# Patient Record
Sex: Female | Born: 1940 | Race: White | Hispanic: No | Marital: Married | Smoking: Never smoker
Health system: Southern US, Community
[De-identification: ages and names within clinical notes are randomized; demographics above are authoritative.]

## PROBLEM LIST (undated history)

## (undated) DIAGNOSIS — C55 Malignant neoplasm of uterus, part unspecified: Secondary | ICD-10-CM

## (undated) DIAGNOSIS — C801 Malignant (primary) neoplasm, unspecified: Secondary | ICD-10-CM

## (undated) DIAGNOSIS — R569 Unspecified convulsions: Secondary | ICD-10-CM

## (undated) DIAGNOSIS — E119 Type 2 diabetes mellitus without complications: Secondary | ICD-10-CM

## (undated) HISTORY — PX: TONSILLECTOMY: SUR1361

## (undated) HISTORY — PX: ABDOMINAL HYSTERECTOMY: SHX81

---

## 1998-05-12 ENCOUNTER — Other Ambulatory Visit: Admission: RE | Admit: 1998-05-12 | Discharge: 1998-05-12 | Payer: Self-pay | Admitting: Obstetrics and Gynecology

## 1998-05-30 ENCOUNTER — Inpatient Hospital Stay (HOSPITAL_COMMUNITY): Admission: RE | Admit: 1998-05-30 | Discharge: 1998-06-01 | Payer: Self-pay | Admitting: Obstetrics and Gynecology

## 1999-12-06 ENCOUNTER — Encounter: Admission: RE | Admit: 1999-12-06 | Discharge: 1999-12-06 | Payer: Self-pay | Admitting: *Deleted

## 1999-12-06 ENCOUNTER — Encounter: Payer: Self-pay | Admitting: *Deleted

## 1999-12-25 ENCOUNTER — Encounter: Admission: RE | Admit: 1999-12-25 | Discharge: 1999-12-25 | Payer: Self-pay | Admitting: *Deleted

## 1999-12-25 ENCOUNTER — Encounter: Payer: Self-pay | Admitting: *Deleted

## 2009-11-24 ENCOUNTER — Encounter: Admission: RE | Admit: 2009-11-24 | Discharge: 2009-11-24 | Payer: Self-pay | Admitting: Internal Medicine

## 2009-12-27 ENCOUNTER — Encounter: Admission: RE | Admit: 2009-12-27 | Discharge: 2009-12-27 | Payer: Self-pay | Admitting: Internal Medicine

## 2009-12-27 ENCOUNTER — Other Ambulatory Visit: Admission: RE | Admit: 2009-12-27 | Discharge: 2009-12-27 | Payer: Self-pay | Admitting: Interventional Radiology

## 2010-07-10 ENCOUNTER — Other Ambulatory Visit: Payer: Self-pay | Admitting: Gastroenterology

## 2012-01-08 ENCOUNTER — Other Ambulatory Visit: Payer: Self-pay | Admitting: Internal Medicine

## 2012-01-08 DIAGNOSIS — Z1231 Encounter for screening mammogram for malignant neoplasm of breast: Secondary | ICD-10-CM

## 2012-02-06 ENCOUNTER — Ambulatory Visit
Admission: RE | Admit: 2012-02-06 | Discharge: 2012-02-06 | Disposition: A | Discharge status: Home / Self Care | Payer: Medicare Other | Source: Ambulatory Visit | Attending: Internal Medicine | Admitting: Internal Medicine

## 2012-02-06 DIAGNOSIS — Z1231 Encounter for screening mammogram for malignant neoplasm of breast: Secondary | ICD-10-CM

## 2014-10-06 ENCOUNTER — Other Ambulatory Visit: Payer: Self-pay | Admitting: Internal Medicine

## 2014-10-06 DIAGNOSIS — Z1231 Encounter for screening mammogram for malignant neoplasm of breast: Secondary | ICD-10-CM

## 2014-10-13 ENCOUNTER — Ambulatory Visit
Admission: RE | Admit: 2014-10-13 | Discharge: 2014-10-13 | Disposition: A | Discharge status: Home / Self Care | Payer: Medicare HMO | Source: Ambulatory Visit | Attending: Internal Medicine | Admitting: Internal Medicine

## 2014-10-13 DIAGNOSIS — Z1231 Encounter for screening mammogram for malignant neoplasm of breast: Secondary | ICD-10-CM

## 2015-04-10 ENCOUNTER — Other Ambulatory Visit: Payer: Self-pay | Admitting: Internal Medicine

## 2015-04-10 DIAGNOSIS — G459 Transient cerebral ischemic attack, unspecified: Secondary | ICD-10-CM | POA: Diagnosis not present

## 2015-04-10 DIAGNOSIS — M25569 Pain in unspecified knee: Secondary | ICD-10-CM | POA: Diagnosis not present

## 2015-04-10 DIAGNOSIS — E7800 Pure hypercholesterolemia, unspecified: Secondary | ICD-10-CM | POA: Diagnosis not present

## 2015-04-10 DIAGNOSIS — Z0000 Encounter for general adult medical examination without abnormal findings: Secondary | ICD-10-CM | POA: Diagnosis not present

## 2015-04-10 DIAGNOSIS — E119 Type 2 diabetes mellitus without complications: Secondary | ICD-10-CM | POA: Diagnosis not present

## 2015-04-10 DIAGNOSIS — Z1389 Encounter for screening for other disorder: Secondary | ICD-10-CM | POA: Diagnosis not present

## 2015-04-20 ENCOUNTER — Ambulatory Visit
Admission: RE | Admit: 2015-04-20 | Discharge: 2015-04-20 | Disposition: A | Discharge status: Home / Self Care | Payer: Medicare HMO | Source: Ambulatory Visit | Attending: Internal Medicine | Admitting: Internal Medicine

## 2015-04-20 DIAGNOSIS — I6523 Occlusion and stenosis of bilateral carotid arteries: Secondary | ICD-10-CM | POA: Diagnosis not present

## 2015-04-20 DIAGNOSIS — G459 Transient cerebral ischemic attack, unspecified: Secondary | ICD-10-CM

## 2015-10-11 DIAGNOSIS — R69 Illness, unspecified: Secondary | ICD-10-CM | POA: Diagnosis not present

## 2015-10-11 DIAGNOSIS — E119 Type 2 diabetes mellitus without complications: Secondary | ICD-10-CM | POA: Diagnosis not present

## 2016-04-18 ENCOUNTER — Other Ambulatory Visit: Payer: Self-pay | Admitting: Internal Medicine

## 2016-04-18 ENCOUNTER — Ambulatory Visit
Admission: RE | Admit: 2016-04-18 | Discharge: 2016-04-18 | Disposition: A | Discharge status: Home / Self Care | Payer: Medicare HMO | Source: Ambulatory Visit | Attending: Internal Medicine | Admitting: Internal Medicine

## 2016-04-18 DIAGNOSIS — Z0000 Encounter for general adult medical examination without abnormal findings: Secondary | ICD-10-CM | POA: Diagnosis not present

## 2016-04-18 DIAGNOSIS — M1711 Unilateral primary osteoarthritis, right knee: Secondary | ICD-10-CM | POA: Diagnosis not present

## 2016-04-18 DIAGNOSIS — Z86010 Personal history of colonic polyps: Secondary | ICD-10-CM | POA: Diagnosis not present

## 2016-04-18 DIAGNOSIS — E7800 Pure hypercholesterolemia, unspecified: Secondary | ICD-10-CM | POA: Diagnosis not present

## 2016-04-18 DIAGNOSIS — Z1389 Encounter for screening for other disorder: Secondary | ICD-10-CM | POA: Diagnosis not present

## 2016-04-18 DIAGNOSIS — M25561 Pain in right knee: Secondary | ICD-10-CM | POA: Diagnosis not present

## 2016-04-18 DIAGNOSIS — E119 Type 2 diabetes mellitus without complications: Secondary | ICD-10-CM | POA: Diagnosis not present

## 2016-04-18 DIAGNOSIS — E049 Nontoxic goiter, unspecified: Secondary | ICD-10-CM | POA: Diagnosis not present

## 2016-04-18 DIAGNOSIS — G8929 Other chronic pain: Secondary | ICD-10-CM | POA: Diagnosis not present

## 2016-05-04 DIAGNOSIS — Z791 Long term (current) use of non-steroidal anti-inflammatories (NSAID): Secondary | ICD-10-CM | POA: Diagnosis not present

## 2016-05-04 DIAGNOSIS — Z0000 Encounter for general adult medical examination without abnormal findings: Secondary | ICD-10-CM | POA: Diagnosis not present

## 2016-05-04 DIAGNOSIS — M13161 Monoarthritis, not elsewhere classified, right knee: Secondary | ICD-10-CM | POA: Diagnosis not present

## 2016-05-04 DIAGNOSIS — M13162 Monoarthritis, not elsewhere classified, left knee: Secondary | ICD-10-CM | POA: Diagnosis not present

## 2016-05-04 DIAGNOSIS — G4700 Insomnia, unspecified: Secondary | ICD-10-CM | POA: Diagnosis not present

## 2016-05-04 DIAGNOSIS — Z972 Presence of dental prosthetic device (complete) (partial): Secondary | ICD-10-CM | POA: Diagnosis not present

## 2016-05-04 DIAGNOSIS — E7800 Pure hypercholesterolemia, unspecified: Secondary | ICD-10-CM | POA: Diagnosis not present

## 2016-05-04 DIAGNOSIS — R69 Illness, unspecified: Secondary | ICD-10-CM | POA: Diagnosis not present

## 2016-05-04 DIAGNOSIS — R600 Localized edema: Secondary | ICD-10-CM | POA: Diagnosis not present

## 2016-05-22 ENCOUNTER — Other Ambulatory Visit: Payer: Self-pay | Admitting: Gastroenterology

## 2016-07-30 ENCOUNTER — Encounter (HOSPITAL_COMMUNITY): Payer: Self-pay | Admitting: *Deleted

## 2016-07-30 ENCOUNTER — Ambulatory Visit (HOSPITAL_COMMUNITY)
Admission: RE | Admit: 2016-07-30 | Discharge: 2016-07-30 | Disposition: A | Discharge status: Home / Self Care | Payer: Medicare HMO | Source: Ambulatory Visit | Attending: Gastroenterology | Admitting: Gastroenterology

## 2016-07-30 ENCOUNTER — Ambulatory Visit (HOSPITAL_COMMUNITY): Payer: Medicare HMO | Admitting: Certified Registered Nurse Anesthetist

## 2016-07-30 ENCOUNTER — Encounter (HOSPITAL_COMMUNITY)
Admission: RE | Disposition: A | Discharge status: Home / Self Care | Payer: Self-pay | Source: Ambulatory Visit | Attending: Gastroenterology

## 2016-07-30 DIAGNOSIS — Z86010 Personal history of colonic polyps: Secondary | ICD-10-CM | POA: Insufficient documentation

## 2016-07-30 DIAGNOSIS — Z8542 Personal history of malignant neoplasm of other parts of uterus: Secondary | ICD-10-CM | POA: Insufficient documentation

## 2016-07-30 DIAGNOSIS — Z1211 Encounter for screening for malignant neoplasm of colon: Secondary | ICD-10-CM | POA: Diagnosis not present

## 2016-07-30 DIAGNOSIS — Z9049 Acquired absence of other specified parts of digestive tract: Secondary | ICD-10-CM | POA: Diagnosis not present

## 2016-07-30 DIAGNOSIS — E119 Type 2 diabetes mellitus without complications: Secondary | ICD-10-CM | POA: Diagnosis not present

## 2016-07-30 DIAGNOSIS — Z90710 Acquired absence of both cervix and uterus: Secondary | ICD-10-CM | POA: Diagnosis not present

## 2016-07-30 DIAGNOSIS — E042 Nontoxic multinodular goiter: Secondary | ICD-10-CM | POA: Insufficient documentation

## 2016-07-30 DIAGNOSIS — E7800 Pure hypercholesterolemia, unspecified: Secondary | ICD-10-CM | POA: Diagnosis not present

## 2016-07-30 DIAGNOSIS — K5730 Diverticulosis of large intestine without perforation or abscess without bleeding: Secondary | ICD-10-CM | POA: Diagnosis not present

## 2016-07-30 HISTORY — DX: Malignant (primary) neoplasm, unspecified: C80.1

## 2016-07-30 HISTORY — DX: Malignant neoplasm of uterus, part unspecified: C55

## 2016-07-30 HISTORY — PX: COLONOSCOPY WITH PROPOFOL: SHX5780

## 2016-07-30 SURGERY — COLONOSCOPY WITH PROPOFOL
Anesthesia: Monitor Anesthesia Care

## 2016-07-30 MED ORDER — ONDANSETRON HCL 4 MG/2ML IJ SOLN
INTRAMUSCULAR | Status: DC | PRN
  Administered 2016-07-30: 4 mg via INTRAVENOUS

## 2016-07-30 MED ORDER — EPHEDRINE 5 MG/ML INJ
INTRAVENOUS | Status: AC
  Filled 2016-07-30: qty 10

## 2016-07-30 MED ORDER — ONDANSETRON HCL 4 MG/2ML IJ SOLN
INTRAMUSCULAR | Status: AC
  Filled 2016-07-30: qty 2

## 2016-07-30 MED ORDER — PROPOFOL 10 MG/ML IV BOLUS
INTRAVENOUS | Status: AC
  Filled 2016-07-30: qty 40

## 2016-07-30 MED ORDER — PROPOFOL 500 MG/50ML IV EMUL
INTRAVENOUS | Status: DC | PRN
Start: 2016-07-30 — End: 2016-07-30
  Administered 2016-07-30: 125 ug/kg/min via INTRAVENOUS

## 2016-07-30 MED ORDER — EPHEDRINE SULFATE 50 MG/ML IJ SOLN
INTRAMUSCULAR | Status: DC | PRN
Start: 2016-07-30 — End: 2016-07-30
  Administered 2016-07-30: 5 mg via INTRAVENOUS

## 2016-07-30 MED ORDER — LACTATED RINGERS IV SOLN
INTRAVENOUS | Status: DC
  Administered 2016-07-30: 08:00:00 via INTRAVENOUS

## 2016-07-30 SURGICAL SUPPLY — 21 items

## 2016-07-30 NOTE — Op Note (Signed)
[facility] Patient Name: Tracy Odonnell Procedure Date: 07/30/2016 MRN: [id number] Attending MD: Garlan Fair , MD Date of Birth: May 05, 1940 CSN: [id number] Age: 76 Admit Type: Outpatient Procedure:                Colonoscopy Indications:              High risk colon cancer surveillance: Personal                            history of non-advanced adenoma Providers:                Garlan Fair, MD, Kingsley Plan, RN, Lillie Fragmin, RN, Cherylynn Ridges, Technician, Christell Faith, CRNA Referring MD:              Medicines:                Propofol per Anesthesia Complications:            No immediate complications. Estimated Blood Loss:     Estimated blood loss: none. Procedure:                Pre-Anesthesia Assessment:                           - Prior to the procedure, a History and Physical                            was performed, and patient medications and                            allergies were reviewed. The patient's tolerance of                            previous anesthesia was also reviewed. The risks                            and benefits of the procedure and the sedation                            options and risks were discussed with the patient.                            All questions were answered, and informed consent                            was obtained. Prior Anticoagulants: The patient has                            taken no previous anticoagulant or antiplatelet                            agents. ASA Grade  Assessment: II - A patient with                            mild systemic disease. After reviewing the risks                            and benefits, the patient was deemed in                            satisfactory condition to undergo the procedure.                           After obtaining informed consent, the colonoscope                            was passed under direct vision.  Throughout the                            procedure, the patient's blood pressure, pulse, and                            oxygen saturations were monitored continuously. The                            EC-[id number] ([id number]) scope was introduced through                            the anus and advanced to the the cecum, identified                            by appendiceal orifice and ileocecal valve. The                            colonoscopy was performed without difficulty. The                            patient tolerated the procedure well. The quality                            of the bowel preparation was good. The terminal                            ileum, the ileocecal valve, the appendiceal orifice                            and the rectum were photographed. Scope In: 8:23:26 AM Scope Out: 8:41:11 AM Scope Withdrawal Time: 0 hours 9 minutes 3 seconds  Total Procedure Duration: 0 hours 17 minutes 45 seconds  Findings:      The perianal and digital rectal examinations were normal.      The entire examined colon appeared normal. Left colonic diverticulosis       was present. Impression:               - The entire examined colon is normal.                           -  No specimens collected. Moderate Sedation:      N/A- Per Anesthesia Care Recommendation:           - Patient has a contact number available for                            emergencies. The signs and symptoms of potential                            delayed complications were discussed with the                            patient. Return to normal activities tomorrow.                            Written discharge instructions were provided to the                            patient.                           - Repeat colonoscopy is not recommended for                            surveillance.                           - Resume previous diet.                           - Continue present medications. Procedure Code(s):        ---  Professional ---                           G5364, Colorectal cancer screening; colonoscopy on                            individual at high risk Diagnosis Code(s):        --- Professional ---                           Z86.010, Personal history of colonic polyps CPT copyright 2016 [employer]. All rights reserved. The codes documented in this report are preliminary and upon coder review may  be revised to meet current compliance requirements. Earle Gell, MD Garlan Fair, MD 07/30/2016 8:47:05 AM This report has been signed electronically. Number of Addenda: 0

## 2016-07-30 NOTE — Anesthesia Preprocedure Evaluation (Signed)
Anesthesia Evaluation  Patient identified by MRN, date of birth, ID band Patient awake    Reviewed: Allergy & Precautions, NPO status , Patient's Chart, lab work & pertinent test results  Airway Mallampati: II  TM Distance: >3 FB Neck ROM: Full    Dental no notable dental hx. (+) Partial Upper, Poor Dentition, Chipped   Pulmonary neg pulmonary ROS,    Pulmonary exam normal breath sounds clear to auscultation       Cardiovascular negative cardio ROS Normal cardiovascular exam Rhythm:Regular Rate:Normal     Neuro/Psych negative neurological ROS  negative psych ROS   GI/Hepatic negative GI ROS, Neg liver ROS,   Endo/Other  negative endocrine ROS  Renal/GU negative Renal ROS  negative genitourinary   Musculoskeletal negative musculoskeletal ROS (+)   Abdominal   Peds negative pediatric ROS (+)  Hematology negative hematology ROS (+)   Anesthesia Other Findings   Reproductive/Obstetrics negative OB ROS                             Anesthesia Physical Anesthesia Plan  ASA: II  Anesthesia Plan: MAC   Post-op Pain Management:    Induction: Intravenous  PONV Risk Score and Plan: 0  Airway Management Planned:   Additional Equipment:   Intra-op Plan:   Post-operative Plan:   Informed Consent: I have reviewed the patients History and Physical, chart, labs and discussed the procedure including the risks, benefits and alternatives for the proposed anesthesia with the patient or authorized representative who has indicated his/her understanding and acceptance.   Dental advisory given  Plan Discussed with: CRNA  Anesthesia Plan Comments:         Anesthesia Quick Evaluation

## 2016-07-30 NOTE — H&P (Signed)
Procedure: Surveillance colonoscopy. 07/10/2010 colonoscopy was performed with removal of two 3 mm tubular adenomatous cecal polyps  History: The patient is a 76 year old female born 1941-02-15. She is scheduled to undergo a surveillance colonoscopy today.  Past medical history: Type 2 diabetes mellitus. Hypercholesterolemia. Uterine cancer. Total abdominal hysterectomy. Bilateral salpingo-oophorectomy. Thyroid nodules. Left humeral fracture. Tonsillectomy. Benign right ovarian tumor. Cholecystectomy.  Exam: The patient is alert and lying comfortably on the endoscopy stretcher. Abdomen is soft and nontender to palpation. Lungs are clear to auscultation. Cardiac exam reveals a regular rhythm.  Plan: Proceed with surveillance colonoscopy

## 2016-07-30 NOTE — Transfer of Care (Signed)
Immediate Anesthesia Transfer of Care Note  Patient: Tracy Odonnell  Procedure(s) Performed: Procedure(s): COLONOSCOPY WITH PROPOFOL (N/A)  Patient Location: PACU  Anesthesia Type:MAC  Level of Consciousness:  sedated, patient cooperative and responds to stimulation  Airway & Oxygen Therapy:Patient Spontanous Breathing and Patient connected to face mask oxgen  Post-op Assessment:  Report given to PACU RN and Post -op Vital signs reviewed and stable  Post vital signs:  Reviewed and stable  Last Vitals:  Vitals:   07/30/16 0745  BP: (!) 137/55  Resp: 16  Temp: 91.6 C    Complications: No apparent anesthesia complications

## 2016-07-30 NOTE — Discharge Instructions (Signed)
Colonoscopy, Adult, Care After  This sheet gives you information about how to care for yourself after your procedure. Your health care provider may also give you more specific instructions. If you have problems or questions, contact your health care provider.  What can I expect after the procedure?  After the procedure, it is common to have:  · A small amount of blood in your stool for 24 hours after the procedure.  · Some gas.  · Mild abdominal cramping or bloating.    Follow these instructions at home:  General instructions    · For the first 24 hours after the procedure:  ? Do not drive or use machinery.  ? Do not sign important documents.  ? Do not drink alcohol.  ? Do your regular daily activities at a slower pace than normal.  ? Eat soft, easy-to-digest foods.  ? Rest often.  · Take over-the-counter or prescription medicines only as told by your health care provider.  · It is up to you to get the results of your procedure. Ask your health care provider, or the department performing the procedure, when your results will be ready.  Relieving cramping and bloating  · Try walking around when you have cramps or feel bloated.  · Apply heat to your abdomen as told by your health care provider. Use a heat source that your health care provider recommends, such as a moist heat pack or a heating pad.  ? Place a towel between your skin and the heat source.  ? Leave the heat on for 20-30 minutes.  ? Remove the heat if your skin turns bright red. This is especially important if you are unable to feel pain, heat, or cold. You may have a greater risk of getting burned.  Eating and drinking  · Drink enough fluid to keep your urine clear or pale yellow.  · Resume your normal diet as instructed by your health care provider. Avoid heavy or fried foods that are hard to digest.  · Avoid drinking alcohol for as long as instructed by your health care provider.  Contact a health care provider if:  · You have blood in your stool 2-3  days after the procedure.  Get help right away if:  · You have more than a small spotting of blood in your stool.  · You pass large blood clots in your stool.  · Your abdomen is swollen.  · You have nausea or vomiting.  · You have a fever.  · You have increasing abdominal pain that is not relieved with medicine.  This information is not intended to replace advice given to you by your health care provider. Make sure you discuss any questions you have with your health care provider.  Document Released: 09/26/2003 Document Revised: 11/06/2015 Document Reviewed: 04/25/2015  [employer]vier Interactive Patient Education © 2018 [employer]

## 2016-07-30 NOTE — Anesthesia Postprocedure Evaluation (Signed)
Anesthesia Post Note  Patient: Tracy Odonnell  Procedure(s) Performed: Procedure(s) (LRB): COLONOSCOPY WITH PROPOFOL (N/A)     Patient location during evaluation: Endoscopy Anesthesia Type: MAC Level of consciousness: awake and alert Pain management: pain level controlled Vital Signs Assessment: post-procedure vital signs reviewed and stable Respiratory status: spontaneous breathing, nonlabored ventilation, respiratory function stable and patient connected to nasal cannula oxygen Cardiovascular status: stable and blood pressure returned to baseline Anesthetic complications: no    Last Vitals:  Vitals:   07/30/16 0850 07/30/16 0900  BP: (!) 117/50 138/75  Pulse: (!) 55 (!) 59  Resp: 17 17  Temp:      Last Pain:  Vitals:   07/30/16 0745  TempSrc: Oral                 [name]

## 2016-07-31 ENCOUNTER — Encounter (HOSPITAL_COMMUNITY): Payer: Self-pay | Admitting: Gastroenterology

## 2016-10-24 DIAGNOSIS — E119 Type 2 diabetes mellitus without complications: Secondary | ICD-10-CM | POA: Diagnosis not present

## 2016-10-24 DIAGNOSIS — R69 Illness, unspecified: Secondary | ICD-10-CM | POA: Diagnosis not present

## 2016-10-24 DIAGNOSIS — E1165 Type 2 diabetes mellitus with hyperglycemia: Secondary | ICD-10-CM | POA: Diagnosis not present

## 2017-02-13 DIAGNOSIS — H11423 Conjunctival edema, bilateral: Secondary | ICD-10-CM | POA: Diagnosis not present

## 2017-02-13 DIAGNOSIS — H52223 Regular astigmatism, bilateral: Secondary | ICD-10-CM | POA: Diagnosis not present

## 2017-02-13 DIAGNOSIS — H40033 Anatomical narrow angle, bilateral: Secondary | ICD-10-CM | POA: Diagnosis not present

## 2017-02-13 DIAGNOSIS — H18413 Arcus senilis, bilateral: Secondary | ICD-10-CM | POA: Diagnosis not present

## 2017-02-13 DIAGNOSIS — H25013 Cortical age-related cataract, bilateral: Secondary | ICD-10-CM | POA: Diagnosis not present

## 2017-02-13 DIAGNOSIS — H2513 Age-related nuclear cataract, bilateral: Secondary | ICD-10-CM | POA: Diagnosis not present

## 2017-02-13 DIAGNOSIS — H11153 Pinguecula, bilateral: Secondary | ICD-10-CM | POA: Diagnosis not present

## 2017-02-13 DIAGNOSIS — H25043 Posterior subcapsular polar age-related cataract, bilateral: Secondary | ICD-10-CM | POA: Diagnosis not present

## 2017-02-13 DIAGNOSIS — H5213 Myopia, bilateral: Secondary | ICD-10-CM | POA: Diagnosis not present

## 2017-02-25 DIAGNOSIS — I4510 Unspecified right bundle-branch block: Secondary | ICD-10-CM | POA: Diagnosis not present

## 2017-02-25 DIAGNOSIS — R55 Syncope and collapse: Secondary | ICD-10-CM | POA: Diagnosis not present

## 2017-02-25 DIAGNOSIS — R4182 Altered mental status, unspecified: Secondary | ICD-10-CM | POA: Diagnosis not present

## 2017-02-25 DIAGNOSIS — R410 Disorientation, unspecified: Secondary | ICD-10-CM | POA: Diagnosis not present

## 2017-02-25 DIAGNOSIS — R402441 Other coma, without documented Glasgow coma scale score, or with partial score reported, in the field [EMT or ambulance]: Secondary | ICD-10-CM | POA: Diagnosis not present

## 2017-02-25 DIAGNOSIS — S01512A Laceration without foreign body of oral cavity, initial encounter: Secondary | ICD-10-CM | POA: Diagnosis not present

## 2017-02-25 DIAGNOSIS — R569 Unspecified convulsions: Secondary | ICD-10-CM | POA: Diagnosis not present

## 2017-02-25 DIAGNOSIS — X58XXXA Exposure to other specified factors, initial encounter: Secondary | ICD-10-CM | POA: Diagnosis not present

## 2017-02-26 DIAGNOSIS — I4510 Unspecified right bundle-branch block: Secondary | ICD-10-CM | POA: Diagnosis not present

## 2017-03-31 DIAGNOSIS — Z79899 Other long term (current) drug therapy: Secondary | ICD-10-CM | POA: Diagnosis not present

## 2017-03-31 DIAGNOSIS — R569 Unspecified convulsions: Secondary | ICD-10-CM | POA: Diagnosis not present

## 2017-04-03 DIAGNOSIS — R55 Syncope and collapse: Secondary | ICD-10-CM | POA: Diagnosis not present

## 2017-04-03 DIAGNOSIS — R258 Other abnormal involuntary movements: Secondary | ICD-10-CM | POA: Diagnosis not present

## 2017-04-03 DIAGNOSIS — G319 Degenerative disease of nervous system, unspecified: Secondary | ICD-10-CM | POA: Diagnosis not present

## 2017-04-03 DIAGNOSIS — R569 Unspecified convulsions: Secondary | ICD-10-CM | POA: Diagnosis not present

## 2017-05-02 DIAGNOSIS — H2513 Age-related nuclear cataract, bilateral: Secondary | ICD-10-CM | POA: Diagnosis not present

## 2017-05-02 DIAGNOSIS — H18413 Arcus senilis, bilateral: Secondary | ICD-10-CM | POA: Diagnosis not present

## 2017-05-02 DIAGNOSIS — H25013 Cortical age-related cataract, bilateral: Secondary | ICD-10-CM | POA: Diagnosis not present

## 2017-05-02 DIAGNOSIS — H25043 Posterior subcapsular polar age-related cataract, bilateral: Secondary | ICD-10-CM | POA: Diagnosis not present

## 2017-05-02 DIAGNOSIS — H2512 Age-related nuclear cataract, left eye: Secondary | ICD-10-CM | POA: Diagnosis not present

## 2017-05-08 ENCOUNTER — Other Ambulatory Visit: Payer: Self-pay | Admitting: Internal Medicine

## 2017-05-08 DIAGNOSIS — E669 Obesity, unspecified: Secondary | ICD-10-CM | POA: Diagnosis not present

## 2017-05-08 DIAGNOSIS — E119 Type 2 diabetes mellitus without complications: Secondary | ICD-10-CM | POA: Diagnosis not present

## 2017-05-08 DIAGNOSIS — E7800 Pure hypercholesterolemia, unspecified: Secondary | ICD-10-CM | POA: Diagnosis not present

## 2017-05-08 DIAGNOSIS — Z0000 Encounter for general adult medical examination without abnormal findings: Secondary | ICD-10-CM | POA: Diagnosis not present

## 2017-05-08 DIAGNOSIS — Z1389 Encounter for screening for other disorder: Secondary | ICD-10-CM | POA: Diagnosis not present

## 2017-05-08 DIAGNOSIS — G40909 Epilepsy, unspecified, not intractable, without status epilepticus: Secondary | ICD-10-CM | POA: Diagnosis not present

## 2017-05-08 DIAGNOSIS — Z1231 Encounter for screening mammogram for malignant neoplasm of breast: Secondary | ICD-10-CM

## 2017-05-30 DIAGNOSIS — H2511 Age-related nuclear cataract, right eye: Secondary | ICD-10-CM | POA: Diagnosis not present

## 2017-05-30 DIAGNOSIS — H2512 Age-related nuclear cataract, left eye: Secondary | ICD-10-CM | POA: Diagnosis not present

## 2017-06-04 ENCOUNTER — Encounter: Payer: Self-pay | Admitting: Radiology

## 2017-06-04 ENCOUNTER — Ambulatory Visit
Admission: RE | Admit: 2017-06-04 | Discharge: 2017-06-04 | Disposition: A | Discharge status: Home / Self Care | Payer: Medicare HMO | Source: Ambulatory Visit | Attending: Internal Medicine | Admitting: Internal Medicine

## 2017-06-04 DIAGNOSIS — Z1231 Encounter for screening mammogram for malignant neoplasm of breast: Secondary | ICD-10-CM

## 2017-06-23 DIAGNOSIS — H2511 Age-related nuclear cataract, right eye: Secondary | ICD-10-CM | POA: Diagnosis not present

## 2017-09-11 DIAGNOSIS — Z887 Allergy status to serum and vaccine status: Secondary | ICD-10-CM | POA: Diagnosis not present

## 2017-09-11 DIAGNOSIS — Z79899 Other long term (current) drug therapy: Secondary | ICD-10-CM | POA: Diagnosis not present

## 2017-09-11 DIAGNOSIS — R569 Unspecified convulsions: Secondary | ICD-10-CM | POA: Diagnosis not present

## 2017-12-11 DIAGNOSIS — G40909 Epilepsy, unspecified, not intractable, without status epilepticus: Secondary | ICD-10-CM | POA: Diagnosis not present

## 2017-12-11 DIAGNOSIS — E119 Type 2 diabetes mellitus without complications: Secondary | ICD-10-CM | POA: Diagnosis not present

## 2017-12-11 DIAGNOSIS — Z23 Encounter for immunization: Secondary | ICD-10-CM | POA: Diagnosis not present

## 2018-03-17 DIAGNOSIS — R259 Unspecified abnormal involuntary movements: Secondary | ICD-10-CM | POA: Diagnosis not present

## 2018-03-17 DIAGNOSIS — Z733 Stress, not elsewhere classified: Secondary | ICD-10-CM | POA: Diagnosis not present

## 2018-03-17 DIAGNOSIS — Z79899 Other long term (current) drug therapy: Secondary | ICD-10-CM | POA: Diagnosis not present

## 2018-03-17 DIAGNOSIS — R569 Unspecified convulsions: Secondary | ICD-10-CM | POA: Diagnosis not present

## 2018-07-31 DIAGNOSIS — H02831 Dermatochalasis of right upper eyelid: Secondary | ICD-10-CM | POA: Diagnosis not present

## 2018-07-31 DIAGNOSIS — H18413 Arcus senilis, bilateral: Secondary | ICD-10-CM | POA: Diagnosis not present

## 2018-07-31 DIAGNOSIS — H11153 Pinguecula, bilateral: Secondary | ICD-10-CM | POA: Diagnosis not present

## 2018-07-31 DIAGNOSIS — H26491 Other secondary cataract, right eye: Secondary | ICD-10-CM | POA: Diagnosis not present

## 2018-07-31 DIAGNOSIS — H02834 Dermatochalasis of left upper eyelid: Secondary | ICD-10-CM | POA: Diagnosis not present

## 2018-07-31 DIAGNOSIS — H0102B Squamous blepharitis left eye, upper and lower eyelids: Secondary | ICD-10-CM | POA: Diagnosis not present

## 2018-07-31 DIAGNOSIS — H35371 Puckering of macula, right eye: Secondary | ICD-10-CM | POA: Diagnosis not present

## 2018-07-31 DIAGNOSIS — H0102A Squamous blepharitis right eye, upper and lower eyelids: Secondary | ICD-10-CM | POA: Diagnosis not present

## 2018-07-31 DIAGNOSIS — H26492 Other secondary cataract, left eye: Secondary | ICD-10-CM | POA: Diagnosis not present

## 2018-09-07 DIAGNOSIS — Z1389 Encounter for screening for other disorder: Secondary | ICD-10-CM | POA: Diagnosis not present

## 2018-09-07 DIAGNOSIS — Z0000 Encounter for general adult medical examination without abnormal findings: Secondary | ICD-10-CM | POA: Diagnosis not present

## 2018-10-06 DIAGNOSIS — R609 Edema, unspecified: Secondary | ICD-10-CM | POA: Diagnosis not present

## 2018-10-06 DIAGNOSIS — E7800 Pure hypercholesterolemia, unspecified: Secondary | ICD-10-CM | POA: Diagnosis not present

## 2018-10-06 DIAGNOSIS — M170 Bilateral primary osteoarthritis of knee: Secondary | ICD-10-CM | POA: Diagnosis not present

## 2018-10-06 DIAGNOSIS — E1169 Type 2 diabetes mellitus with other specified complication: Secondary | ICD-10-CM | POA: Diagnosis not present

## 2018-12-12 IMAGING — DX DG KNEE COMPLETE 4+V*R*
4 series · 4 of 4 positions shown · non-contrast
Comparison: 01/31/2010

CLINICAL DATA: Right knee pain for several months, no known injury

EXAM:
RIGHT KNEE - COMPLETE 4+ VIEW

[dg knee complete 4 views right (1 of 4)]
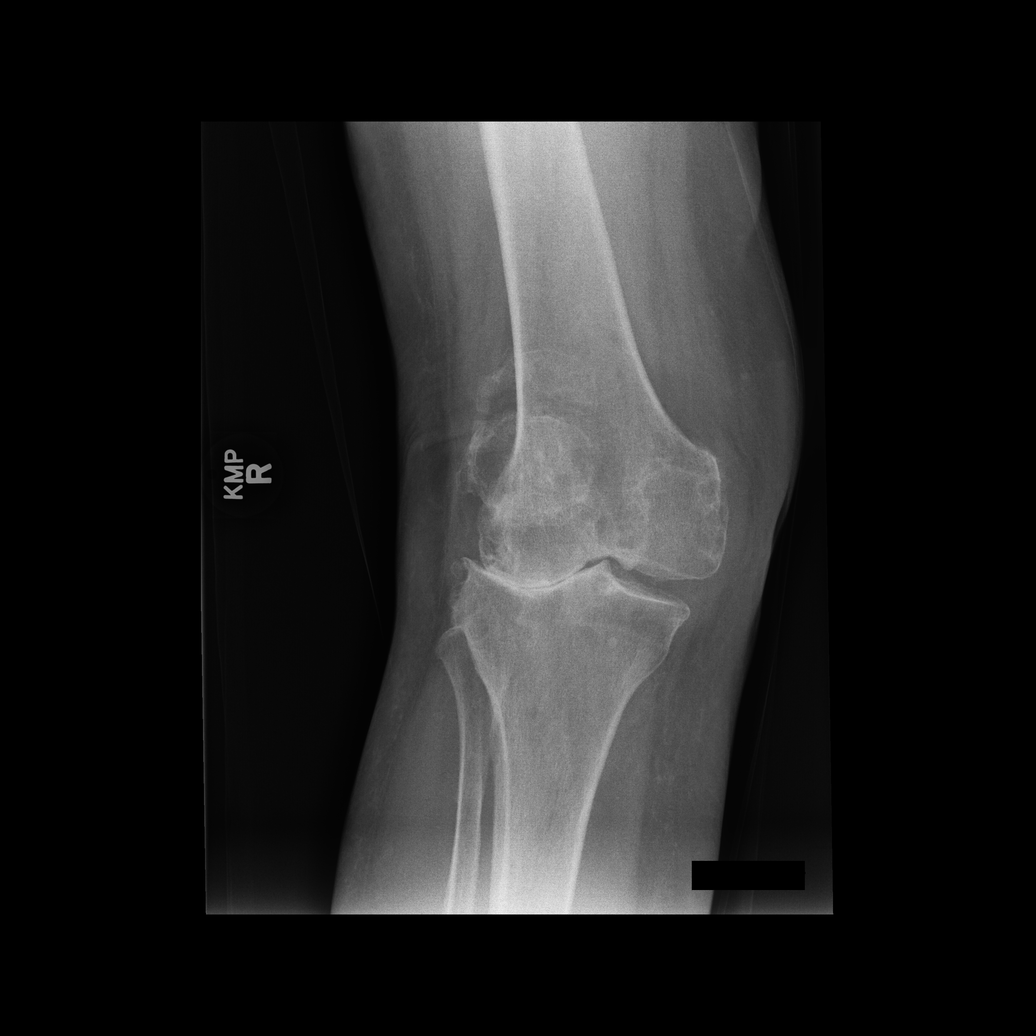

[dg knee complete 4 views right (2 of 4)]
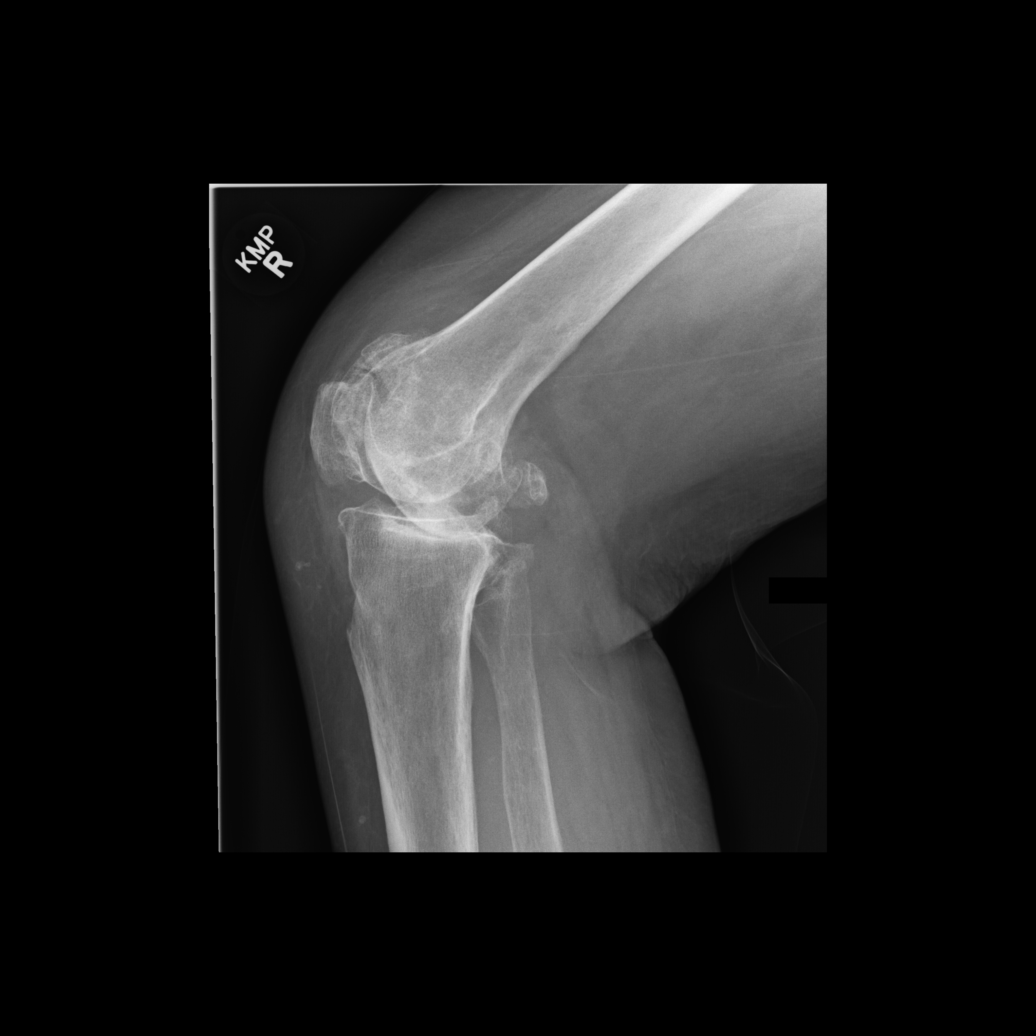

[dg knee complete 4 views right (3 of 4)]
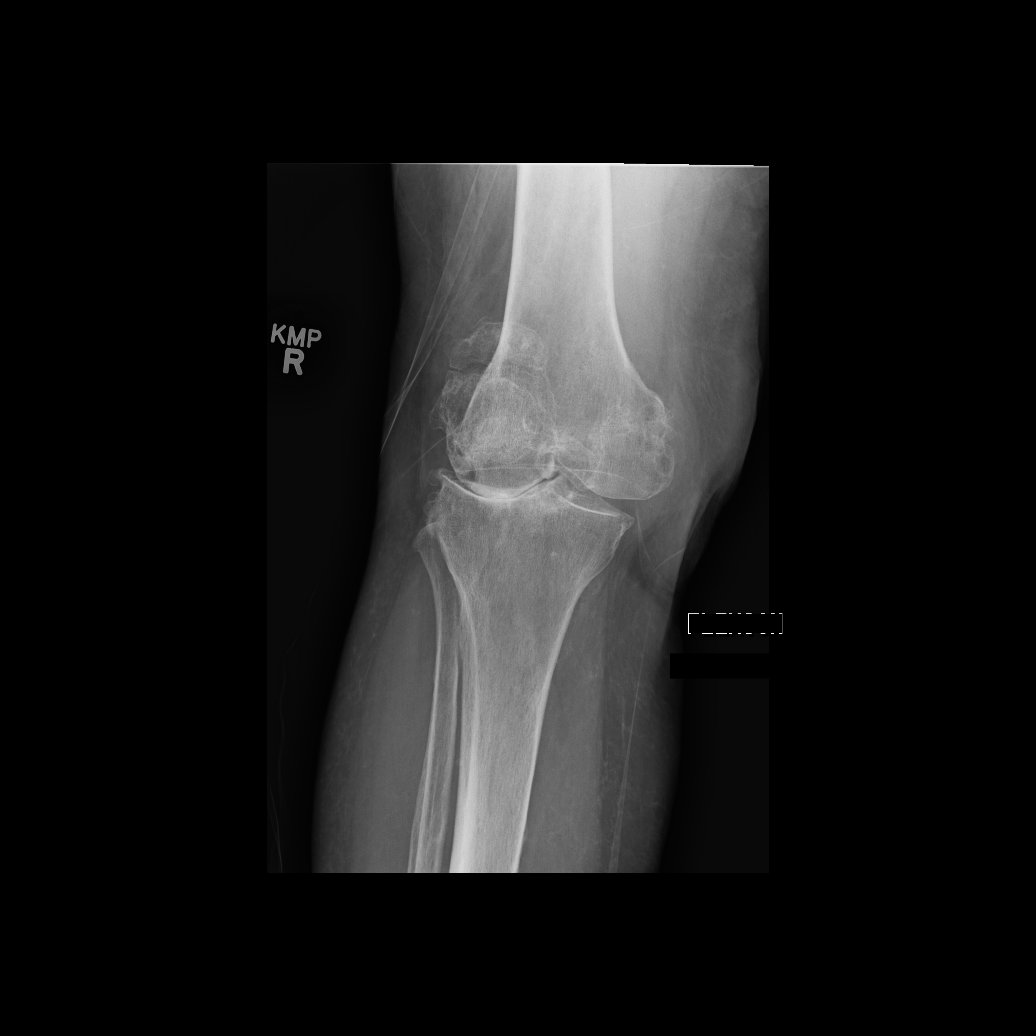

[dg knee complete 4 views right (4 of 4)]
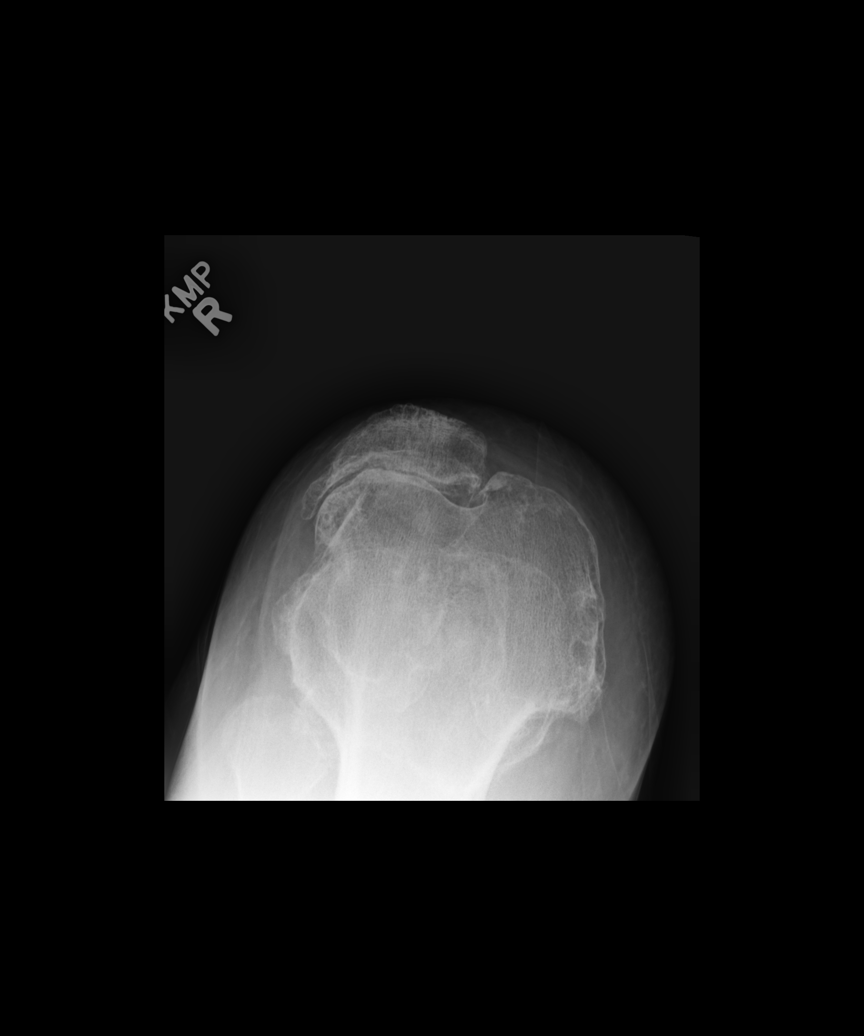

[4 of 4 positions shown; findings below may reference images not displayed]

FINDINGS: Four views of the right knee submitted. Significant degenerative
changes with significant narrowing of of lateral joint compartment.
There is sclerosis of lateral tibial plateau. Mild spurring of
lateral tibial plateau. Significant spurring of patella. Significant
narrowing of patellofemoral joint space. Small joint effusion. Again
noted dystrophic calcifications suprapatellar region. No acute
fracture or subluxation.
IMPRESSION: No acute fracture or subluxation. Extensive osteoarthritic changes
as described above with progression from prior exam. Small joint
effusion.

## 2019-04-15 DIAGNOSIS — E1169 Type 2 diabetes mellitus with other specified complication: Secondary | ICD-10-CM | POA: Diagnosis not present

## 2019-04-15 DIAGNOSIS — R609 Edema, unspecified: Secondary | ICD-10-CM | POA: Diagnosis not present

## 2019-04-15 DIAGNOSIS — G40909 Epilepsy, unspecified, not intractable, without status epilepticus: Secondary | ICD-10-CM | POA: Diagnosis not present

## 2019-04-22 DIAGNOSIS — I6782 Cerebral ischemia: Secondary | ICD-10-CM | POA: Diagnosis not present

## 2019-04-22 DIAGNOSIS — R569 Unspecified convulsions: Secondary | ICD-10-CM | POA: Diagnosis not present

## 2019-04-22 DIAGNOSIS — Z79899 Other long term (current) drug therapy: Secondary | ICD-10-CM | POA: Diagnosis not present

## 2019-04-29 DIAGNOSIS — E1169 Type 2 diabetes mellitus with other specified complication: Secondary | ICD-10-CM | POA: Diagnosis not present

## 2019-05-07 DIAGNOSIS — R197 Diarrhea, unspecified: Secondary | ICD-10-CM | POA: Diagnosis not present

## 2019-05-07 DIAGNOSIS — K5900 Constipation, unspecified: Secondary | ICD-10-CM | POA: Diagnosis not present

## 2019-05-07 DIAGNOSIS — R739 Hyperglycemia, unspecified: Secondary | ICD-10-CM | POA: Diagnosis not present

## 2019-05-07 DIAGNOSIS — N3000 Acute cystitis without hematuria: Secondary | ICD-10-CM | POA: Diagnosis not present

## 2019-05-07 DIAGNOSIS — K8689 Other specified diseases of pancreas: Secondary | ICD-10-CM | POA: Diagnosis not present

## 2019-05-07 DIAGNOSIS — E876 Hypokalemia: Secondary | ICD-10-CM | POA: Diagnosis not present

## 2019-05-07 DIAGNOSIS — G40909 Epilepsy, unspecified, not intractable, without status epilepticus: Secondary | ICD-10-CM | POA: Diagnosis not present

## 2019-05-07 DIAGNOSIS — K56699 Other intestinal obstruction unspecified as to partial versus complete obstruction: Secondary | ICD-10-CM | POA: Diagnosis not present

## 2019-05-07 DIAGNOSIS — R300 Dysuria: Secondary | ICD-10-CM | POA: Diagnosis not present

## 2019-05-07 DIAGNOSIS — K56600 Partial intestinal obstruction, unspecified as to cause: Secondary | ICD-10-CM | POA: Diagnosis not present

## 2019-05-07 DIAGNOSIS — E1165 Type 2 diabetes mellitus with hyperglycemia: Secondary | ICD-10-CM | POA: Diagnosis not present

## 2019-05-07 DIAGNOSIS — R7303 Prediabetes: Secondary | ICD-10-CM | POA: Diagnosis not present

## 2019-05-07 DIAGNOSIS — I82452 Acute embolism and thrombosis of left peroneal vein: Secondary | ICD-10-CM | POA: Diagnosis not present

## 2019-05-07 DIAGNOSIS — I10 Essential (primary) hypertension: Secondary | ICD-10-CM | POA: Diagnosis not present

## 2019-05-07 DIAGNOSIS — R109 Unspecified abdominal pain: Secondary | ICD-10-CM | POA: Diagnosis not present

## 2019-05-07 DIAGNOSIS — E669 Obesity, unspecified: Secondary | ICD-10-CM | POA: Diagnosis not present

## 2019-05-07 DIAGNOSIS — R112 Nausea with vomiting, unspecified: Secondary | ICD-10-CM | POA: Diagnosis not present

## 2019-05-07 DIAGNOSIS — E870 Hyperosmolality and hypernatremia: Secondary | ICD-10-CM | POA: Diagnosis not present

## 2019-05-07 DIAGNOSIS — I82443 Acute embolism and thrombosis of tibial vein, bilateral: Secondary | ICD-10-CM | POA: Diagnosis not present

## 2019-05-07 DIAGNOSIS — R1030 Lower abdominal pain, unspecified: Secondary | ICD-10-CM | POA: Diagnosis not present

## 2019-05-08 DIAGNOSIS — K8689 Other specified diseases of pancreas: Secondary | ICD-10-CM | POA: Diagnosis not present

## 2019-05-08 DIAGNOSIS — G40909 Epilepsy, unspecified, not intractable, without status epilepticus: Secondary | ICD-10-CM | POA: Diagnosis not present

## 2019-05-08 DIAGNOSIS — K56600 Partial intestinal obstruction, unspecified as to cause: Secondary | ICD-10-CM | POA: Diagnosis not present

## 2019-05-08 DIAGNOSIS — R739 Hyperglycemia, unspecified: Secondary | ICD-10-CM | POA: Diagnosis not present

## 2019-05-08 DIAGNOSIS — R7303 Prediabetes: Secondary | ICD-10-CM | POA: Diagnosis not present

## 2019-05-09 DIAGNOSIS — K56600 Partial intestinal obstruction, unspecified as to cause: Secondary | ICD-10-CM | POA: Diagnosis not present

## 2019-05-09 DIAGNOSIS — R7303 Prediabetes: Secondary | ICD-10-CM | POA: Diagnosis not present

## 2019-05-09 DIAGNOSIS — K8689 Other specified diseases of pancreas: Secondary | ICD-10-CM | POA: Diagnosis not present

## 2019-05-09 DIAGNOSIS — R739 Hyperglycemia, unspecified: Secondary | ICD-10-CM | POA: Diagnosis not present

## 2019-05-09 DIAGNOSIS — G40909 Epilepsy, unspecified, not intractable, without status epilepticus: Secondary | ICD-10-CM | POA: Diagnosis not present

## 2019-05-10 DIAGNOSIS — G40909 Epilepsy, unspecified, not intractable, without status epilepticus: Secondary | ICD-10-CM | POA: Diagnosis not present

## 2019-05-10 DIAGNOSIS — K56600 Partial intestinal obstruction, unspecified as to cause: Secondary | ICD-10-CM | POA: Diagnosis not present

## 2019-05-10 DIAGNOSIS — R7303 Prediabetes: Secondary | ICD-10-CM | POA: Diagnosis not present

## 2019-05-10 DIAGNOSIS — E870 Hyperosmolality and hypernatremia: Secondary | ICD-10-CM | POA: Diagnosis not present

## 2019-05-10 DIAGNOSIS — K8689 Other specified diseases of pancreas: Secondary | ICD-10-CM | POA: Diagnosis not present

## 2019-05-11 DIAGNOSIS — E1165 Type 2 diabetes mellitus with hyperglycemia: Secondary | ICD-10-CM | POA: Diagnosis not present

## 2019-05-11 DIAGNOSIS — K56600 Partial intestinal obstruction, unspecified as to cause: Secondary | ICD-10-CM | POA: Diagnosis not present

## 2019-05-11 DIAGNOSIS — G40909 Epilepsy, unspecified, not intractable, without status epilepticus: Secondary | ICD-10-CM | POA: Diagnosis not present

## 2019-05-11 DIAGNOSIS — E870 Hyperosmolality and hypernatremia: Secondary | ICD-10-CM | POA: Diagnosis not present

## 2019-05-11 DIAGNOSIS — K8689 Other specified diseases of pancreas: Secondary | ICD-10-CM | POA: Diagnosis not present

## 2019-05-12 DIAGNOSIS — E1165 Type 2 diabetes mellitus with hyperglycemia: Secondary | ICD-10-CM | POA: Diagnosis not present

## 2019-05-12 DIAGNOSIS — E870 Hyperosmolality and hypernatremia: Secondary | ICD-10-CM | POA: Diagnosis not present

## 2019-05-12 DIAGNOSIS — R300 Dysuria: Secondary | ICD-10-CM | POA: Diagnosis not present

## 2019-05-12 DIAGNOSIS — K8689 Other specified diseases of pancreas: Secondary | ICD-10-CM | POA: Diagnosis not present

## 2019-05-12 DIAGNOSIS — G40909 Epilepsy, unspecified, not intractable, without status epilepticus: Secondary | ICD-10-CM | POA: Diagnosis not present

## 2019-05-12 DIAGNOSIS — K56600 Partial intestinal obstruction, unspecified as to cause: Secondary | ICD-10-CM | POA: Diagnosis not present

## 2019-05-13 DIAGNOSIS — G40909 Epilepsy, unspecified, not intractable, without status epilepticus: Secondary | ICD-10-CM | POA: Diagnosis not present

## 2019-05-13 DIAGNOSIS — K56600 Partial intestinal obstruction, unspecified as to cause: Secondary | ICD-10-CM | POA: Diagnosis not present

## 2019-05-13 DIAGNOSIS — K8689 Other specified diseases of pancreas: Secondary | ICD-10-CM | POA: Diagnosis not present

## 2019-05-13 DIAGNOSIS — E1165 Type 2 diabetes mellitus with hyperglycemia: Secondary | ICD-10-CM | POA: Diagnosis not present

## 2019-05-13 DIAGNOSIS — E870 Hyperosmolality and hypernatremia: Secondary | ICD-10-CM | POA: Diagnosis not present

## 2019-05-14 DIAGNOSIS — G40909 Epilepsy, unspecified, not intractable, without status epilepticus: Secondary | ICD-10-CM | POA: Diagnosis not present

## 2019-05-14 DIAGNOSIS — K56600 Partial intestinal obstruction, unspecified as to cause: Secondary | ICD-10-CM | POA: Diagnosis not present

## 2019-05-14 DIAGNOSIS — E1165 Type 2 diabetes mellitus with hyperglycemia: Secondary | ICD-10-CM | POA: Diagnosis not present

## 2019-05-14 DIAGNOSIS — K8689 Other specified diseases of pancreas: Secondary | ICD-10-CM | POA: Diagnosis not present

## 2019-05-15 DIAGNOSIS — K8689 Other specified diseases of pancreas: Secondary | ICD-10-CM | POA: Diagnosis not present

## 2019-05-15 DIAGNOSIS — G40909 Epilepsy, unspecified, not intractable, without status epilepticus: Secondary | ICD-10-CM | POA: Diagnosis not present

## 2019-05-15 DIAGNOSIS — K56600 Partial intestinal obstruction, unspecified as to cause: Secondary | ICD-10-CM | POA: Diagnosis not present

## 2019-05-15 DIAGNOSIS — E1165 Type 2 diabetes mellitus with hyperglycemia: Secondary | ICD-10-CM | POA: Diagnosis not present

## 2019-05-16 DIAGNOSIS — E1165 Type 2 diabetes mellitus with hyperglycemia: Secondary | ICD-10-CM | POA: Diagnosis not present

## 2019-05-16 DIAGNOSIS — I82443 Acute embolism and thrombosis of tibial vein, bilateral: Secondary | ICD-10-CM | POA: Diagnosis not present

## 2019-05-16 DIAGNOSIS — I82452 Acute embolism and thrombosis of left peroneal vein: Secondary | ICD-10-CM | POA: Diagnosis not present

## 2019-05-16 DIAGNOSIS — K56600 Partial intestinal obstruction, unspecified as to cause: Secondary | ICD-10-CM | POA: Diagnosis not present

## 2019-05-16 DIAGNOSIS — G40909 Epilepsy, unspecified, not intractable, without status epilepticus: Secondary | ICD-10-CM | POA: Diagnosis not present

## 2019-05-16 DIAGNOSIS — K8689 Other specified diseases of pancreas: Secondary | ICD-10-CM | POA: Diagnosis not present

## 2019-05-17 DIAGNOSIS — K56600 Partial intestinal obstruction, unspecified as to cause: Secondary | ICD-10-CM | POA: Diagnosis not present

## 2019-05-21 DIAGNOSIS — E119 Type 2 diabetes mellitus without complications: Secondary | ICD-10-CM | POA: Diagnosis not present

## 2019-05-21 DIAGNOSIS — N390 Urinary tract infection, site not specified: Secondary | ICD-10-CM | POA: Diagnosis not present

## 2019-05-21 DIAGNOSIS — D6869 Other thrombophilia: Secondary | ICD-10-CM | POA: Diagnosis not present

## 2019-05-21 DIAGNOSIS — Z7984 Long term (current) use of oral hypoglycemic drugs: Secondary | ICD-10-CM | POA: Diagnosis not present

## 2019-05-21 DIAGNOSIS — Z8719 Personal history of other diseases of the digestive system: Secondary | ICD-10-CM | POA: Diagnosis not present

## 2019-05-22 DIAGNOSIS — R69 Illness, unspecified: Secondary | ICD-10-CM | POA: Diagnosis not present

## 2019-05-24 DIAGNOSIS — R945 Abnormal results of liver function studies: Secondary | ICD-10-CM | POA: Diagnosis not present

## 2019-05-25 DIAGNOSIS — R7989 Other specified abnormal findings of blood chemistry: Secondary | ICD-10-CM | POA: Diagnosis not present

## 2019-05-25 DIAGNOSIS — K7689 Other specified diseases of liver: Secondary | ICD-10-CM | POA: Diagnosis not present

## 2019-05-27 DIAGNOSIS — R17 Unspecified jaundice: Secondary | ICD-10-CM | POA: Diagnosis not present

## 2019-05-27 DIAGNOSIS — Z7984 Long term (current) use of oral hypoglycemic drugs: Secondary | ICD-10-CM | POA: Diagnosis not present

## 2019-05-27 DIAGNOSIS — K8689 Other specified diseases of pancreas: Secondary | ICD-10-CM | POA: Diagnosis not present

## 2019-05-27 DIAGNOSIS — E1165 Type 2 diabetes mellitus with hyperglycemia: Secondary | ICD-10-CM | POA: Diagnosis not present

## 2019-05-27 DIAGNOSIS — R7989 Other specified abnormal findings of blood chemistry: Secondary | ICD-10-CM | POA: Diagnosis not present

## 2019-05-27 DIAGNOSIS — K5641 Fecal impaction: Secondary | ICD-10-CM | POA: Diagnosis not present

## 2019-05-27 DIAGNOSIS — Z8719 Personal history of other diseases of the digestive system: Secondary | ICD-10-CM | POA: Diagnosis not present

## 2019-05-29 ENCOUNTER — Encounter (HOSPITAL_COMMUNITY): Payer: Medicare HMO

## 2019-05-29 ENCOUNTER — Other Ambulatory Visit: Payer: Self-pay

## 2019-05-29 ENCOUNTER — Encounter (HOSPITAL_COMMUNITY): Payer: Self-pay | Admitting: Pediatrics

## 2019-05-29 ENCOUNTER — Inpatient Hospital Stay (HOSPITAL_COMMUNITY)
Admission: EM | Admit: 2019-05-29 | Discharge: 2019-06-05 | DRG: 436 | Disposition: A | Discharge status: Home Health | Payer: Medicare HMO | Attending: Internal Medicine | Admitting: Internal Medicine

## 2019-05-29 ENCOUNTER — Emergency Department (HOSPITAL_COMMUNITY): Payer: Medicare HMO

## 2019-05-29 DIAGNOSIS — R569 Unspecified convulsions: Secondary | ICD-10-CM | POA: Diagnosis not present

## 2019-05-29 DIAGNOSIS — I82409 Acute embolism and thrombosis of unspecified deep veins of unspecified lower extremity: Secondary | ICD-10-CM | POA: Diagnosis not present

## 2019-05-29 DIAGNOSIS — R748 Abnormal levels of other serum enzymes: Secondary | ICD-10-CM | POA: Diagnosis not present

## 2019-05-29 DIAGNOSIS — Z515 Encounter for palliative care: Secondary | ICD-10-CM | POA: Diagnosis not present

## 2019-05-29 DIAGNOSIS — D649 Anemia, unspecified: Secondary | ICD-10-CM | POA: Diagnosis not present

## 2019-05-29 DIAGNOSIS — R109 Unspecified abdominal pain: Secondary | ICD-10-CM | POA: Diagnosis not present

## 2019-05-29 DIAGNOSIS — Z79899 Other long term (current) drug therapy: Secondary | ICD-10-CM | POA: Diagnosis not present

## 2019-05-29 DIAGNOSIS — K861 Other chronic pancreatitis: Secondary | ICD-10-CM | POA: Diagnosis present

## 2019-05-29 DIAGNOSIS — R1110 Vomiting, unspecified: Secondary | ICD-10-CM | POA: Diagnosis not present

## 2019-05-29 DIAGNOSIS — Z90710 Acquired absence of both cervix and uterus: Secondary | ICD-10-CM | POA: Diagnosis not present

## 2019-05-29 DIAGNOSIS — R110 Nausea: Secondary | ICD-10-CM | POA: Diagnosis not present

## 2019-05-29 DIAGNOSIS — E1151 Type 2 diabetes mellitus with diabetic peripheral angiopathy without gangrene: Secondary | ICD-10-CM | POA: Diagnosis not present

## 2019-05-29 DIAGNOSIS — K8689 Other specified diseases of pancreas: Secondary | ICD-10-CM | POA: Diagnosis not present

## 2019-05-29 DIAGNOSIS — I10 Essential (primary) hypertension: Secondary | ICD-10-CM | POA: Diagnosis not present

## 2019-05-29 DIAGNOSIS — Z8542 Personal history of malignant neoplasm of other parts of uterus: Secondary | ICD-10-CM

## 2019-05-29 DIAGNOSIS — Z800 Family history of malignant neoplasm of digestive organs: Secondary | ICD-10-CM | POA: Diagnosis not present

## 2019-05-29 DIAGNOSIS — Z794 Long term (current) use of insulin: Secondary | ICD-10-CM | POA: Diagnosis not present

## 2019-05-29 DIAGNOSIS — C259 Malignant neoplasm of pancreas, unspecified: Secondary | ICD-10-CM

## 2019-05-29 DIAGNOSIS — E860 Dehydration: Secondary | ICD-10-CM | POA: Diagnosis not present

## 2019-05-29 DIAGNOSIS — Z86718 Personal history of other venous thrombosis and embolism: Secondary | ICD-10-CM | POA: Diagnosis not present

## 2019-05-29 DIAGNOSIS — G40909 Epilepsy, unspecified, not intractable, without status epilepticus: Secondary | ICD-10-CM | POA: Diagnosis present

## 2019-05-29 DIAGNOSIS — R932 Abnormal findings on diagnostic imaging of liver and biliary tract: Secondary | ICD-10-CM | POA: Diagnosis not present

## 2019-05-29 DIAGNOSIS — K5730 Diverticulosis of large intestine without perforation or abscess without bleeding: Secondary | ICD-10-CM | POA: Diagnosis present

## 2019-05-29 DIAGNOSIS — R1013 Epigastric pain: Secondary | ICD-10-CM | POA: Diagnosis not present

## 2019-05-29 DIAGNOSIS — E1169 Type 2 diabetes mellitus with other specified complication: Secondary | ICD-10-CM | POA: Diagnosis not present

## 2019-05-29 DIAGNOSIS — C250 Malignant neoplasm of head of pancreas: Principal | ICD-10-CM | POA: Diagnosis present

## 2019-05-29 DIAGNOSIS — E876 Hypokalemia: Secondary | ICD-10-CM | POA: Diagnosis not present

## 2019-05-29 DIAGNOSIS — K5900 Constipation, unspecified: Secondary | ICD-10-CM | POA: Diagnosis present

## 2019-05-29 DIAGNOSIS — R188 Other ascites: Secondary | ICD-10-CM | POA: Diagnosis not present

## 2019-05-29 DIAGNOSIS — Z20822 Contact with and (suspected) exposure to covid-19: Secondary | ICD-10-CM | POA: Diagnosis not present

## 2019-05-29 DIAGNOSIS — C240 Malignant neoplasm of extrahepatic bile duct: Secondary | ICD-10-CM | POA: Diagnosis not present

## 2019-05-29 DIAGNOSIS — Z9049 Acquired absence of other specified parts of digestive tract: Secondary | ICD-10-CM | POA: Diagnosis not present

## 2019-05-29 DIAGNOSIS — K8051 Calculus of bile duct without cholangitis or cholecystitis with obstruction: Secondary | ICD-10-CM | POA: Diagnosis present

## 2019-05-29 DIAGNOSIS — R030 Elevated blood-pressure reading, without diagnosis of hypertension: Secondary | ICD-10-CM | POA: Diagnosis present

## 2019-05-29 DIAGNOSIS — K838 Other specified diseases of biliary tract: Secondary | ICD-10-CM | POA: Diagnosis not present

## 2019-05-29 DIAGNOSIS — R17 Unspecified jaundice: Secondary | ICD-10-CM

## 2019-05-29 DIAGNOSIS — Z7189 Other specified counseling: Secondary | ICD-10-CM | POA: Diagnosis not present

## 2019-05-29 DIAGNOSIS — E1165 Type 2 diabetes mellitus with hyperglycemia: Secondary | ICD-10-CM

## 2019-05-29 DIAGNOSIS — R519 Headache, unspecified: Secondary | ICD-10-CM | POA: Diagnosis not present

## 2019-05-29 DIAGNOSIS — Z03818 Encounter for observation for suspected exposure to other biological agents ruled out: Secondary | ICD-10-CM | POA: Diagnosis not present

## 2019-05-29 DIAGNOSIS — R52 Pain, unspecified: Secondary | ICD-10-CM | POA: Diagnosis not present

## 2019-05-29 DIAGNOSIS — E119 Type 2 diabetes mellitus without complications: Secondary | ICD-10-CM

## 2019-05-29 DIAGNOSIS — C787 Secondary malignant neoplasm of liver and intrahepatic bile duct: Secondary | ICD-10-CM

## 2019-05-29 DIAGNOSIS — R1010 Upper abdominal pain, unspecified: Secondary | ICD-10-CM | POA: Diagnosis not present

## 2019-05-29 DIAGNOSIS — R1031 Right lower quadrant pain: Secondary | ICD-10-CM | POA: Diagnosis not present

## 2019-05-29 DIAGNOSIS — L299 Pruritus, unspecified: Secondary | ICD-10-CM | POA: Diagnosis present

## 2019-05-29 DIAGNOSIS — Z66 Do not resuscitate: Secondary | ICD-10-CM | POA: Diagnosis not present

## 2019-05-29 DIAGNOSIS — R1084 Generalized abdominal pain: Secondary | ICD-10-CM | POA: Diagnosis not present

## 2019-05-29 DIAGNOSIS — K831 Obstruction of bile duct: Secondary | ICD-10-CM | POA: Diagnosis not present

## 2019-05-29 DIAGNOSIS — S0990XA Unspecified injury of head, initial encounter: Secondary | ICD-10-CM | POA: Diagnosis not present

## 2019-05-29 DIAGNOSIS — R935 Abnormal findings on diagnostic imaging of other abdominal regions, including retroperitoneum: Secondary | ICD-10-CM | POA: Diagnosis not present

## 2019-05-29 HISTORY — DX: Type 2 diabetes mellitus without complications: E11.9

## 2019-05-29 HISTORY — DX: Unspecified convulsions: R56.9

## 2019-05-29 LAB — CBC WITH DIFFERENTIAL/PLATELET
Abs Immature Granulocytes: 0.05 10*3/uL (ref 0.00–0.07)
Basophils Absolute: 0 10*3/uL (ref 0.0–0.1)
Basophils Relative: 0 %
Eosinophils Absolute: 0 10*3/uL (ref 0.0–0.5)
Eosinophils Relative: 0 %
HCT: 36.6 % (ref 36.0–46.0)
Hemoglobin: 11.8 g/dL — ABNORMAL LOW (ref 12.0–15.0)
Immature Granulocytes: 1 %
Lymphocytes Relative: 4 %
Lymphs Abs: 0.4 10*3/uL — ABNORMAL LOW (ref 0.7–4.0)
MCH: 30.6 pg (ref 26.0–34.0)
MCHC: 32.2 g/dL (ref 30.0–36.0)
MCV: 95.1 fL (ref 80.0–100.0)
Monocytes Absolute: 0.5 10*3/uL (ref 0.1–1.0)
Monocytes Relative: 4 %
Neutro Abs: 9.7 10*3/uL — ABNORMAL HIGH (ref 1.7–7.7)
Neutrophils Relative %: 91 %
Platelets: 372 10*3/uL (ref 150–400)
RBC: 3.85 MIL/uL — ABNORMAL LOW (ref 3.87–5.11)
RDW: 16.9 % — ABNORMAL HIGH (ref 11.5–15.5)
WBC: 10.6 10*3/uL — ABNORMAL HIGH (ref 4.0–10.5)
nRBC: 0 % (ref 0.0–0.2)

## 2019-05-29 LAB — COMPREHENSIVE METABOLIC PANEL
ALT: 444 U/L — ABNORMAL HIGH (ref 0–44)
AST: 393 U/L — ABNORMAL HIGH (ref 15–41)
Albumin: 3.3 g/dL — ABNORMAL LOW (ref 3.5–5.0)
Alkaline Phosphatase: 782 U/L — ABNORMAL HIGH (ref 38–126)
Anion gap: 17 — ABNORMAL HIGH (ref 5–15)
BUN: 16 mg/dL (ref 8–23)
CO2: 29 mmol/L (ref 22–32)
Calcium: 9.7 mg/dL (ref 8.9–10.3)
Chloride: 92 mmol/L — ABNORMAL LOW (ref 98–111)
Creatinine, Ser: 0.65 mg/dL (ref 0.44–1.00)
GFR calc Af Amer: >60 mL/min (ref >60)
GFR calc non Af Amer: >60 mL/min (ref >60)
Glucose, Bld: 320 mg/dL — ABNORMAL HIGH (ref 70–99)
Potassium: 4.1 mmol/L (ref 3.5–5.1)
Sodium: 138 mmol/L (ref 135–145)
Total Bilirubin: 4.1 mg/dL — ABNORMAL HIGH (ref 0.3–1.2)
Total Protein: 6.9 g/dL (ref 6.5–8.1)

## 2019-05-29 LAB — URINALYSIS, ROUTINE W REFLEX MICROSCOPIC
Bacteria, UA: NONE SEEN
Bilirubin Urine: NEGATIVE
Glucose, UA: >=500 mg/dL — AB
Hgb urine dipstick: NEGATIVE
Ketones, ur: 80 mg/dL — AB
Nitrite: NEGATIVE
Protein, ur: NEGATIVE mg/dL
Specific Gravity, Urine: >1.046 — ABNORMAL HIGH (ref 1.005–1.030)
pH: 5 (ref 5.0–8.0)

## 2019-05-29 LAB — HEPATITIS PANEL, ACUTE
HCV Ab: NONREACTIVE
Hep A IgM: NONREACTIVE
Hep B C IgM: NONREACTIVE
Hepatitis B Surface Ag: NONREACTIVE

## 2019-05-29 LAB — AMMONIA: Ammonia: 36 umol/L — ABNORMAL HIGH (ref 9–35)

## 2019-05-29 LAB — PROTIME-INR
INR: 1 (ref 0.8–1.2)
Prothrombin Time: 13.2 seconds (ref 11.4–15.2)

## 2019-05-29 LAB — GLUCOSE, CAPILLARY
Glucose-Capillary: 255 mg/dL — ABNORMAL HIGH (ref 70–99)
Glucose-Capillary: 185 mg/dL — ABNORMAL HIGH (ref 70–99)

## 2019-05-29 LAB — MAGNESIUM: Magnesium: 2 mg/dL (ref 1.7–2.4)

## 2019-05-29 LAB — HEMOGLOBIN A1C
Hgb A1c MFr Bld: 7.7 % — ABNORMAL HIGH (ref 4.8–5.6)
Mean Plasma Glucose: 174.29 mg/dL

## 2019-05-29 LAB — SARS CORONAVIRUS 2 (TAT 6-24 HRS): SARS Coronavirus 2: NEGATIVE

## 2019-05-29 LAB — LACTIC ACID, PLASMA: Lactic Acid, Venous: 1.1 mmol/L (ref 0.5–1.9)

## 2019-05-29 LAB — LIPASE, BLOOD: Lipase: 40 U/L (ref 11–51)

## 2019-05-29 MED ORDER — LABETALOL HCL 5 MG/ML IV SOLN: 10.0000 mg | Freq: Four times a day (QID) | INTRAVENOUS | Status: DC | PRN

## 2019-05-29 MED ORDER — SODIUM CHLORIDE 0.9 % IV SOLN: INTRAVENOUS | Status: DC

## 2019-05-29 MED ORDER — INSULIN ASPART 100 UNIT/ML ~~LOC~~ SOLN
0.0000 [IU] | SUBCUTANEOUS | Status: DC
  Administered 2019-05-30 (×2): 2 [IU] via SUBCUTANEOUS
  Administered 2019-05-30 (×2): 3 [IU] via SUBCUTANEOUS
  Administered 2019-05-31: 21:00:00 2 [IU] via SUBCUTANEOUS
  Administered 2019-05-31: 3 [IU] via SUBCUTANEOUS
  Administered 2019-05-31: 2 [IU] via SUBCUTANEOUS
  Administered 2019-06-02: 3 [IU] via SUBCUTANEOUS
  Administered 2019-06-02: 5 [IU] via SUBCUTANEOUS
  Administered 2019-06-02: 2 [IU] via SUBCUTANEOUS
  Administered 2019-06-03: 18:00:00 5 [IU] via SUBCUTANEOUS
  Administered 2019-06-03: 3 [IU] via SUBCUTANEOUS
  Administered 2019-06-03 – 2019-06-04 (×3): 2 [IU] via SUBCUTANEOUS
  Administered 2019-06-04: 3 [IU] via SUBCUTANEOUS
  Administered 2019-06-05: 01:00:00 2 [IU] via SUBCUTANEOUS

## 2019-05-29 MED ORDER — INSULIN GLARGINE 100 UNIT/ML ~~LOC~~ SOLN
12.0000 [IU] | Freq: Every day | SUBCUTANEOUS | Status: DC
  Administered 2019-05-29 – 2019-06-03 (×6): 12 [IU] via SUBCUTANEOUS
  Filled 2019-05-29 (×8): qty 0.12

## 2019-05-29 MED ORDER — PIPERACILLIN-TAZOBACTAM 3.375 G IVPB
3.3750 g | Freq: Three times a day (TID) | INTRAVENOUS | Status: DC
  Administered 2019-05-29 – 2019-06-02 (×11): 3.375 g via INTRAVENOUS
  Filled 2019-05-29 (×12): qty 50

## 2019-05-29 MED ORDER — HYDROMORPHONE HCL 1 MG/ML IJ SOLN
0.5000 mg | INTRAMUSCULAR | Status: DC | PRN
  Administered 2019-06-01: 15:00:00 0.5 mg via INTRAVENOUS
  Filled 2019-05-29: qty 1

## 2019-05-29 MED ORDER — INSULIN ASPART 100 UNIT/ML ~~LOC~~ SOLN
0.0000 [IU] | Freq: Three times a day (TID) | SUBCUTANEOUS | Status: DC
  Administered 2019-05-29: 18:00:00 5 [IU] via SUBCUTANEOUS

## 2019-05-29 MED ORDER — METOCLOPRAMIDE HCL 5 MG/ML IJ SOLN
10.0000 mg | Freq: Once | INTRAMUSCULAR | Status: AC
  Administered 2019-05-29: 10 mg via INTRAVENOUS
  Filled 2019-05-29: qty 2

## 2019-05-29 MED ORDER — INSULIN ASPART 100 UNIT/ML ~~LOC~~ SOLN: 0.0000 [IU] | Freq: Every day | SUBCUTANEOUS | Status: DC

## 2019-05-29 MED ORDER — LEVETIRACETAM IN NACL 500 MG/100ML IV SOLN
500.0000 mg | Freq: Two times a day (BID) | INTRAVENOUS | Status: DC
  Administered 2019-05-29 – 2019-06-05 (×14): 500 mg via INTRAVENOUS
  Filled 2019-05-29 (×16): qty 100

## 2019-05-29 MED ORDER — ACETAMINOPHEN 325 MG PO TABS: 650.0000 mg | ORAL_TABLET | Freq: Four times a day (QID) | ORAL | Status: DC | PRN

## 2019-05-29 MED ORDER — ONDANSETRON HCL 4 MG/2ML IJ SOLN
4.0000 mg | Freq: Four times a day (QID) | INTRAMUSCULAR | Status: DC | PRN
  Administered 2019-05-29 – 2019-06-03 (×5): 4 mg via INTRAVENOUS
  Filled 2019-05-29 (×5): qty 2

## 2019-05-29 MED ORDER — SODIUM CHLORIDE 0.9 % IV BOLUS
1000.0000 mL | Freq: Once | INTRAVENOUS | Status: AC
  Administered 2019-05-29: 1000 mL via INTRAVENOUS

## 2019-05-29 MED ORDER — IOHEXOL 300 MG/ML  SOLN
100.0000 mL | Freq: Once | INTRAMUSCULAR | Status: AC | PRN
  Administered 2019-05-29: 15:00:00 100 mL via INTRAVENOUS

## 2019-05-29 MED ORDER — ONDANSETRON HCL 4 MG PO TABS: 4.0000 mg | ORAL_TABLET | Freq: Four times a day (QID) | ORAL | Status: DC | PRN

## 2019-05-29 MED ORDER — ACETAMINOPHEN 650 MG RE SUPP: 650.0000 mg | Freq: Four times a day (QID) | RECTAL | Status: DC | PRN

## 2019-05-29 MED ORDER — ENOXAPARIN SODIUM 40 MG/0.4ML ~~LOC~~ SOLN
40.0000 mg | SUBCUTANEOUS | Status: DC
  Administered 2019-05-29: 40 mg via SUBCUTANEOUS
  Filled 2019-05-29: qty 0.4

## 2019-05-29 NOTE — H&P (Addendum)
History and Physical    Tracy Odonnell [id number] DOB: 1940/05/08 DOA: 05/29/2019  PCP: Lavone Orn, MD  Patient coming from: Home I have personally briefly reviewed patient's old medical records in [place]  Chief Complaint: Right upper quadrant pain and vomiting  HPI: Tracy Odonnell is a 79 y.o. female with medical history significant of insulin-dependent diabetes mellitus, seizure, uterine cancer status post hysterectomy, obesity presents to emergency department due to right upper quadrant pain and vomiting since 2 days.  Vomitus is bilious, dark brown/green in color, copious amount, too many episodes to count, associated with right upper quadrant pain, generalized pruritus, decreased appetite.  No history of dysuria, hematuria, fever, hematemesis, melena.  Reports constipation for which she has been taking stool softeners with little to no help.  She tells me that last night she fell out of her bed trying to put some lotion on her nightstand.  States that she did strike her face against the nightstand.  Denies loss of consciousness.  Reviewed records from care everywhere: Patient recently hospitalized at [facility] due to bowel obstruction.  She was managed conservatively.  Her hospital course was complicated with Citrobacter koseri UTI sensitive with Cipro and bilateral lower extremity DVT.  She discharged home on Cipro and Eliquis 10 mg twice daily for 7 days and then 5 mg twice daily for 3 months.    Patient tells me that she took Eliquis for several days and she stopped taking it as per her PCP recommendations? (Couldn't find the office note in EMR).  She denies worsening leg swelling or redness.  Has history of seizure and diabetes for which she is compliant with her home medicines.  She lives alone at home and denies smoking, alcohol, illicit drug use.  ED Course: Upon arrival to ED: Patient's blood pressure elevated, she is afebrile with  leukocytosis of 10.6, elevated liver enzymes, AST: 393, ALT: 444, alkaline phosphatase: 72, bilirubin: 4.1, lipase: WNL, INR: WNL, ammonia: 36, UA and lactic acid: Pending.  CT head shows mild periventricular small vessel disease.  No infarction, mass or hemorrhage.  CT abdomen/pelvis shows intrahepatic bile duct dilation.  EDP consulted GI.  [facility] hospitalist consulted for admission for elevated liver enzymes.  Review of Systems: As per HPI otherwise negative.    Past Medical History:  Diagnosis Date  . Cancer ([place])   . DM (diabetes mellitus) ([place])   . Seizures ([place])   . Uterine cancer [facility])     Past Surgical History:  Procedure Laterality Date  . ABDOMINAL HYSTERECTOMY    . COLONOSCOPY WITH PROPOFOL N/A 07/30/2016   Procedure: COLONOSCOPY WITH PROPOFOL;  Surgeon: Garlan Fair, MD;  Location: [facility];  Service: Endoscopy;  Laterality: N/A;  . TONSILLECTOMY       reports that she has never smoked. She has never used smokeless tobacco. She reports that she does not drink alcohol or use drugs.  Allergies  Allergen Reactions  . Influenza Vaccines Other (See Comments)    PT STATES SHE GETS THE FLU FROM THE VACCINE    No family history on file.  Prior to Admission medications   Medication Sig Start Date End Date Taking? Authorizing Provider  Insulin Glargine (BASAGLAR KWIKPEN) 100 UNIT/ML Inject 12 Units into the skin at bedtime.   Yes [provider]  levETIRAcetam (KEPPRA) 500 MG tablet Take 500 mg by mouth 2 (two) times daily.   Yes [provider]    Physical Exam: Vitals:  05/29/19 1224 05/29/19 1400 05/29/19 1530 05/29/19 1630  BP:  (!) 144/63 (!) 145/63 (!) 154/66  Pulse:  93 85 88  Resp:  18 17 18   Temp:      TempSrc:      SpO2:  98% 99% 99%  Weight: 81.2 kg     Height: 5\' 2"  (1.575 m)       Constitutional: NAD, calm, comfortable, communicating well, obese Eyes: PERRL, lids and conjunctivae: Icteric ENMT: Mucous membranes are moist.  Posterior pharynx clear of any exudate or lesions.Normal dentition.  Neck: normal, supple, no masses, no thyromegaly Respiratory: clear to auscultation bilaterally, no wheezing, no crackles. Normal respiratory effort. No accessory muscle use.  Cardiovascular: Regular rate and rhythm, no murmurs / rubs / gallops. No extremity edema. 2+ pedal pulses. No carotid bruits.  Abdomen: no tenderness, no masses palpated. No hepatosplenomegaly. Bowel sounds positive.  Musculoskeletal: no clubbing / cyanosis. No joint deformity upper and lower extremities. Good ROM, no contractures. Normal muscle tone.  Skin: Right periorbital bruise noted  neurologic: CN 2-12 grossly intact. Sensation intact, DTR normal. Strength 5/5 in all 4.  Psychiatric: Normal judgment and insight. Alert and oriented x 3. Normal mood.    Labs on Admission: I have personally reviewed following labs and imaging studies  CBC: Recent Labs  Lab 05/29/19 1238  WBC 10.6*  NEUTROABS 9.7*  HGB 11.8*  HCT 36.6  MCV 95.1  PLT [id number]   Basic Metabolic Panel: Recent Labs  Lab 05/29/19 1238  NA 138  K 4.1  CL 92*  CO2 29  GLUCOSE 320*  BUN 16  CREATININE 0.65  CALCIUM 9.7   GFR: Estimated Creatinine Clearance: 59 mL/min (by C-G formula based on SCr of 0.65 mg/dL). Liver Function Tests: Recent Labs  Lab 05/29/19 1238  AST 393*  ALT 444*  ALKPHOS 782*  BILITOT 4.1*  PROT 6.9  ALBUMIN 3.3*   Recent Labs  Lab 05/29/19 1238  LIPASE 40   Recent Labs  Lab 05/29/19 1238  AMMONIA 36*   Coagulation Profile: Recent Labs  Lab 05/29/19 1238  INR 1.0   Cardiac Enzymes: No results for input(s): CKTOTAL, CKMB, CKMBINDEX, TROPONINI in the last 168 hours. BNP (last 3 results) No results for input(s): PROBNP in the last 8760 hours. HbA1C: No results for input(s): HGBA1C in the last 72 hours. CBG: No results for input(s): GLUCAP in the last 168 hours. Lipid Profile: No results for input(s): CHOL, HDL, LDLCALC, TRIG,  CHOLHDL, LDLDIRECT in the last 72 hours. Thyroid Function Tests: No results for input(s): TSH, T4TOTAL, FREET4, T3FREE, THYROIDAB in the last 72 hours. Anemia Panel: No results for input(s): VITAMINB12, FOLATE, FERRITIN, TIBC, IRON, RETICCTPCT in the last 72 hours. Urine analysis: No results found for: COLORURINE, APPEARANCEUR, LABSPEC, PHURINE, GLUCOSEU, HGBUR, BILIRUBINUR, KETONESUR, PROTEINUR, UROBILINOGEN, NITRITE, LEUKOCYTESUR  Radiological Exams on Admission: CT Head Wo Contrast  Result Date: 05/29/2019 CLINICAL DATA:  Pain following fall EXAM: CT HEAD WITHOUT CONTRAST TECHNIQUE: Contiguous axial images were obtained from the base of the skull through the vertex without intravenous contrast. COMPARISON:  February 25, 2017 FINDINGS: Brain: Ventricles and sulci are normal in size and configuration. There is no intracranial mass, hemorrhage, extra-axial fluid collection, or midline shift. There is mild small vessel disease in the centra semiovale, stable. No acute infarct evident. Vascular: No hyperdense vessel. There is calcification in each carotid siphon region. Skull: Bony calvarium a appears intact. Sinuses/Orbits: There is mucosal thickening in several ethmoid air cells. Other visualized paranasal sinuses  are clear. Orbits appear symmetric bilaterally. Other: Mastoid air cells are clear. IMPRESSION: Mild periventricular small vessel disease. No acute infarct. No mass or hemorrhage. There are foci of arterial vascular calcification. Mucosal thickening noted in several ethmoid air cells. Electronically Signed   By: Lowella Grip III M.D.   On: 05/29/2019 15:07   CT Abdomen Pelvis W Contrast  Result Date: 05/29/2019 CLINICAL DATA:  Nausea vomiting. Reported history of a bowel obstruction. Mechanical fall last night. EXAM: CT ABDOMEN AND PELVIS WITH CONTRAST TECHNIQUE: Multidetector CT imaging of the abdomen and pelvis was performed using the standard protocol following bolus administration of  intravenous contrast. CONTRAST:  13mL OMNIPAQUE IOHEXOL 300 MG/ML  SOLN COMPARISON:  CT, 05/07/2019 FINDINGS: Lower chest: No acute abnormality. Hepatobiliary: Liver normal in size. No mass or focal lesion. There is significant intrahepatic bile duct dilation. Common hepatic duct measures 13 mm in diameter. Common bile duct appears normal in caliber. Duct dilation was present on the prior CT, but appears increased. Status post cholecystectomy. Pancreas: Atrophy with irregular dilation of the pancreatic duct, from the body through the tail, but no defined mass and no inflammation. Spleen: Normal in size without focal abnormality. Adrenals/Urinary Tract: Adrenal glands are unremarkable. Kidneys are normal, without renal calculi, focal lesion, or hydronephrosis. Bladder is unremarkable. Stomach/Bowel: Normal stomach. Small bowel and colon are normal in caliber. No wall thickening. No inflammation. There are diverticula most evident along the sigmoid colon. No diverticulitis. No evidence of appendicitis Vascular/Lymphatic: Mild aortic atherosclerosis. No aneurysm. No enlarged lymph nodes. Reproductive: Status post hysterectomy. No adnexal masses. Other: Midline anterior abdominal wall incision. No hernia. Trace fluid adjacent of the liver. Musculoskeletal: No fracture or acute finding. No osteoblastic or osteolytic lesions. IMPRESSION: 1. Intrahepatic bile duct dilation extending to the common hepatic duct, increased in severity compared to the prior CT. Etiology of this is unclear. No pancreatic mass. No visualized duct stone. Given the low sensitivity of CT for choledocholithiasis, recommend follow-up MRCP or ERCP. Trace ascites adjacent to the liver. 2. No other evidence of an acute or recent abnormality. No evidence of bowel obstruction or inflammation. Electronically Signed   By: Lajean Manes M.D.   On: 05/29/2019 15:12    Assessment/Plan Principal Problem:   Elevated liver enzymes Active Problems:    Seizures (HCC)   DM (diabetes mellitus) (HCC)   Elevated blood-pressure reading without diagnosis of hypertension   Dilated intrahepatic bile duct    Elevated liver enzymes: -Likely secondary from intrahepatic bile duct dilation.  Patient has history of cholecystectomy -Patient presented with right upper quadrant pain, vomiting, generalized pruritus -CMP shows elevated liver enzymes and elevated total bilirubin.  Patient is afebrile with leukocytosis of 10.6, lipase: WNL, PT/INR: WNL, ammonia: 36, UA and lactic acid: Pending.  COVID-19 pending.  Check hepatitis panel -Admit patient on the floor. -Reviewed CT abdomen/pelvis result. -EDP consulted GI for possible MRCP/EUS-await recommendations -Start on IV Zosyn and IV fluids. -Dilaudid as needed for pain control, Zofran as needed for nausea and vomiting.  We will keep her n.p.o. for now.  Diabetes mellitus: Last A1c 9.0 -Patient has elevated anion gap-likely secondary to dehydration. -Continue IV fluids -Continue home Lantus and started patient on sliding scale insulin and monitor blood sugar closely  Elevated blood pressure reading without diagnosis of hypertension: -Labetalol as needed.  Continue to monitor  Seizure disorder: Patient takes Keppra 500 mg p.o. twice daily -Will change it to Keppra 500 mg IV twice daily as patient is n.p.o. due to  vomiting -On seizure precautions  History of bilateral lower extremity acute DVT: -Reviewed Doppler ultrasound from care everywhere which was done on 05/16/2019 which shows acute DVT of right and left posterior tibial vein.  Patient discharged home on Eliquis which she is not taking as per her PCP recommendation.  -We will get another Doppler ultrasound-if comes back positive we will start on full anticoagulation.  Mechanical fall: -Patient has right eye bruise and abrasion -CT head without contrast: Negative for acute findings -On fall precautions.  Will consult PT  DVT prophylaxis:  Lovenox/SCD/TED Code Status: Full code-confirmed with the patient Family Communication: Patient's cousin present at bedside.  Plan of care discussed with patient and her cousin in length and they verbalized understanding and agreed with it. Disposition Plan: To be determined Consults called: GI by EDP  admission status: Inpatient  Mckinley Jewel MD [facility] Pager [phone]  If 7PM-7AM, please contact night-coverage [email or web address] Password [facility]  05/29/2019, 4:48 PM

## 2019-05-29 NOTE — Progress Notes (Signed)
Pharmacy Antibiotic Note  Tracy Odonnell is a 79 y.o. female admitted on 05/29/2019 with possible intra-abdominal infection.  Pharmacy has been consulted for zosyn dosing.    Plan: Zosyn 3.375g IV every 8 hours (extended infusion) Monitor renal function, GI workup and LOT  Height: 5\' 2"  (157.5 cm) Weight: 81.2 kg (179 lb) IBW/kg (Calculated) : 50.1  Temp (24hrs), Avg:98.8 F (37.1 C), Min:98.8 F (37.1 C), Max:98.8 F (37.1 C)  Recent Labs  Lab 05/29/19 1238  WBC 10.6*  CREATININE 0.65    Estimated Creatinine Clearance: 59 mL/min (by C-G formula based on SCr of 0.65 mg/dL).    Allergies  Allergen Reactions  . Influenza Vaccines Other (See Comments)    PT STATES SHE GETS THE FLU FROM THE VACCINE    Bertis Ruddy, PharmD Clinical Pharmacist ED Pharmacist Phone # [phone] 05/29/2019 4:51 PM

## 2019-05-29 NOTE — ED Provider Notes (Addendum)
[facility] EMERGENCY DEPARTMENT Provider Note   CSN: [id number] Arrival date & time: 05/29/19  1214     History Chief Complaint  Patient presents with  . Nausea    Tracy Odonnell is a 79 y.o. female.  HPI Patient presents to the emergency department with nausea vomiting and a recent hospitalization with a bowel obstruction.  The patient states that since being discharged she is not been she has not been able to eat or drink very well.  The patient states that she is on insulin for diabetes but has not been able to take it due to the fact that she continues to vomit and not being able to eat.  Patient states that she did not take any medications prior to arrival for symptoms.  Patient states last night she fell out of her bed trying to put some lotion on her nightstand.  She states she did strike her face against the nightstand.  Patient states she did not lose consciousness.  The patient denies chest pain, shortness of breath, headache,blurred vision, neck pain, fever, cough, weakness, numbness, dizziness, anorexia, edema, abdominal pain,  diarrhea, rash, back pain, dysuria, hematemesis, bloody stool, near syncope, or syncope.    Past Medical History:  Diagnosis Date  . Cancer ([place])   . Seizures ([place])   . Uterine cancer ([place])     There are no problems to display for this patient.   Past Surgical History:  Procedure Laterality Date  . ABDOMINAL HYSTERECTOMY    . COLONOSCOPY WITH PROPOFOL N/A 07/30/2016   Procedure: COLONOSCOPY WITH PROPOFOL;  Surgeon: Garlan Fair, MD;  Location: [facility];  Service: Endoscopy;  Laterality: N/A;  . TONSILLECTOMY       OB History   No obstetric history on file.     No family history on file.  Social History   Tobacco Use  . Smoking status: Never Smoker  . Smokeless tobacco: Never Used  Substance Use Topics  . Alcohol use: No  . Drug use: No    Home Medications Prior to Admission medications   Medication  Sig Start Date End Date Taking? Authorizing Provider  Insulin Glargine (BASAGLAR KWIKPEN) 100 UNIT/ML Inject 12 Units into the skin at bedtime.   Yes [provider]  levETIRAcetam (KEPPRA) 500 MG tablet Take 500 mg by mouth 2 (two) times daily.   Yes [provider]    Allergies    Influenza vaccines  Review of Systems   Review of Systems All other systems negative except as documented in the HPI. All pertinent positives and negatives as reviewed in the HPI. Physical Exam Updated Vital Signs BP (!) 167/68 (BP Location: Right Arm)   Pulse 84   Temp 98.8 F (37.1 C) (Oral)   Resp 18   Ht 5\' 2"  (1.575 m)   Wt 81.2 kg   LMP  (LMP Unknown)   SpO2 100%   BMI 32.74 kg/m   Physical Exam Vitals and nursing note reviewed.  Constitutional:      General: She is not in acute distress.    Appearance: She is well-developed.  HENT:     Head: Normocephalic and atraumatic.     Mouth/Throat:     Mouth: Mucous membranes are dry.  Eyes:     Pupils: Pupils are equal, round, and reactive to light.  Cardiovascular:     Rate and Rhythm: Normal rate and regular rhythm.     Heart sounds: Normal heart sounds. No murmur. No  friction rub. No gallop.   Pulmonary:     Effort: Pulmonary effort is normal. No respiratory distress.     Breath sounds: Normal breath sounds. No wheezing.  Abdominal:     General: Bowel sounds are normal. There is no distension.     Palpations: Abdomen is soft.     Tenderness: There is abdominal tenderness. There is no guarding or rebound.  Musculoskeletal:     Cervical back: Normal range of motion and neck supple.  Skin:    General: Skin is warm and dry.     Capillary Refill: Capillary refill takes less than 2 seconds.     Findings: No erythema or rash.  Neurological:     Mental Status: She is alert and oriented to person, place, and time.     Motor: No abnormal muscle tone.     Coordination: Coordination normal.  Psychiatric:        Behavior:  Behavior normal.     ED Results / Procedures / Treatments   Labs (all labs ordered are listed, but only abnormal results are displayed) Labs Reviewed  CBC WITH DIFFERENTIAL/PLATELET - Abnormal; Notable for the following components:      Result Value   WBC 10.6 (*)    RBC 3.85 (*)    Hemoglobin 11.8 (*)    RDW 16.9 (*)    Neutro Abs 9.7 (*)    Lymphs Abs 0.4 (*)    All other components within normal limits  COMPREHENSIVE METABOLIC PANEL - Abnormal; Notable for the following components:   Chloride 92 (*)    Glucose, Bld 320 (*)    Albumin 3.3 (*)    AST 393 (*)    ALT 444 (*)    Alkaline Phosphatase 782 (*)    Total Bilirubin 4.1 (*)    Anion gap 17 (*)    All other components within normal limits  AMMONIA - Abnormal; Notable for the following components:   Ammonia 36 (*)    All other components within normal limits  LIPASE, BLOOD  PROTIME-INR  URINALYSIS, ROUTINE W REFLEX MICROSCOPIC    EKG None  Radiology No results found.  Procedures Procedures (including critical care time)  Medications Ordered in ED Medications  metoCLOPramide (REGLAN) injection 10 mg (10 mg Intravenous Given 05/29/19 1258)  sodium chloride 0.9 % bolus 1,000 mL (1,000 mLs Intravenous New Bag/Given 05/29/19 1320)    ED Course  I have reviewed the triage vital signs and the nursing notes.  Pertinent labs & imaging results that were available during my care of the patient were reviewed by me and considered in my medical decision making (see chart for details).    MDM Rules/Calculators/A&P                      The patient will be imaged with a CT scan of the head due to the fall and hitting her head.  Patient will also have imaging of the abdomen due to her continued symptoms of worsening jaundice.  Patient has been fairly stable here in the emergency department.  She is given IV fluids along with antiemetics.  The patient will be admitted to the hospital for further evaluation of her worsening  symptoms.  I spoke with the [facility] hospitalist who admit the patient. Final Clinical Impression(s) / ED Diagnoses Final diagnoses:  None    Rx / DC Orders ED Discharge Orders    None       Dalia Heading, PA-C 05/29/19  [address] PA-C 06/03/19 2152    Pattricia Boss, MD 06/10/19 [phone]

## 2019-05-29 NOTE — ED Triage Notes (Signed)
Came in via EMS; c/o N/V reported hx of bowel obstruction and was recently admitted. Patient had mechanical fall last night as well; occurred a small superficial lacertion on right eye; concern for jaundice as well; patient reported there has been concern about her liver functions already by Dr. Laurann Montana (PCP).

## 2019-05-29 NOTE — ED Provider Notes (Signed)
CONSULT with Dr.Magod with [facility] will consult on patient.  Patient states her pain is controlled apparently she was previously admitted to [facility] hospitalist service by [place], PA-C   Oriyah Lamphear A, PA-C 05/29/19 [id number]    Quintella Reichert, MD 05/30/19 1119

## 2019-05-30 ENCOUNTER — Inpatient Hospital Stay (HOSPITAL_COMMUNITY): Payer: Medicare HMO

## 2019-05-30 ENCOUNTER — Encounter (HOSPITAL_COMMUNITY): Payer: Self-pay | Admitting: Internal Medicine

## 2019-05-30 DIAGNOSIS — R030 Elevated blood-pressure reading, without diagnosis of hypertension: Secondary | ICD-10-CM

## 2019-05-30 DIAGNOSIS — R569 Unspecified convulsions: Secondary | ICD-10-CM

## 2019-05-30 DIAGNOSIS — I82409 Acute embolism and thrombosis of unspecified deep veins of unspecified lower extremity: Secondary | ICD-10-CM

## 2019-05-30 LAB — CBC
HCT: 30.6 % — ABNORMAL LOW (ref 36.0–46.0)
Hemoglobin: 9.7 g/dL — ABNORMAL LOW (ref 12.0–15.0)
MCH: 31.2 pg (ref 26.0–34.0)
MCHC: 31.7 g/dL (ref 30.0–36.0)
MCV: 98.4 fL (ref 80.0–100.0)
Platelets: 291 10*3/uL (ref 150–400)
RBC: 3.11 MIL/uL — ABNORMAL LOW (ref 3.87–5.11)
RDW: 17.5 % — ABNORMAL HIGH (ref 11.5–15.5)
WBC: 9.6 10*3/uL (ref 4.0–10.5)
nRBC: 0 % (ref 0.0–0.2)

## 2019-05-30 LAB — COMPREHENSIVE METABOLIC PANEL
ALT: 351 U/L — ABNORMAL HIGH (ref 0–44)
AST: 312 U/L — ABNORMAL HIGH (ref 15–41)
Albumin: 2.5 g/dL — ABNORMAL LOW (ref 3.5–5.0)
Alkaline Phosphatase: 562 U/L — ABNORMAL HIGH (ref 38–126)
Anion gap: 10 (ref 5–15)
BUN: 13 mg/dL (ref 8–23)
CO2: 30 mmol/L (ref 22–32)
Calcium: 8.7 mg/dL — ABNORMAL LOW (ref 8.9–10.3)
Chloride: 103 mmol/L (ref 98–111)
Creatinine, Ser: 0.47 mg/dL (ref 0.44–1.00)
GFR calc Af Amer: >60 mL/min (ref >60)
GFR calc non Af Amer: >60 mL/min (ref >60)
Glucose, Bld: 153 mg/dL — ABNORMAL HIGH (ref 70–99)
Potassium: 3.8 mmol/L (ref 3.5–5.1)
Sodium: 143 mmol/L (ref 135–145)
Total Bilirubin: 2.7 mg/dL — ABNORMAL HIGH (ref 0.3–1.2)
Total Protein: 5.1 g/dL — ABNORMAL LOW (ref 6.5–8.1)

## 2019-05-30 LAB — HEPARIN LEVEL (UNFRACTIONATED): Heparin Unfractionated: 1.1 IU/mL — ABNORMAL HIGH (ref 0.30–0.70)

## 2019-05-30 LAB — GLUCOSE, CAPILLARY
Glucose-Capillary: 100 mg/dL — ABNORMAL HIGH (ref 70–99)
Glucose-Capillary: 122 mg/dL — ABNORMAL HIGH (ref 70–99)
Glucose-Capillary: 123 mg/dL — ABNORMAL HIGH (ref 70–99)
Glucose-Capillary: 135 mg/dL — ABNORMAL HIGH (ref 70–99)
Glucose-Capillary: 162 mg/dL — ABNORMAL HIGH (ref 70–99)
Glucose-Capillary: 162 mg/dL — ABNORMAL HIGH (ref 70–99)
Glucose-Capillary: 182 mg/dL — ABNORMAL HIGH (ref 70–99)

## 2019-05-30 MED ORDER — SODIUM CHLORIDE 0.9 % IV SOLN: INTRAVENOUS | Status: DC

## 2019-05-30 MED ORDER — HEPARIN (PORCINE) 25000 UT/250ML-% IV SOLN
900.0000 [IU]/h | INTRAVENOUS | Status: DC
  Administered 2019-05-31: 900 [IU]/h via INTRAVENOUS
  Filled 2019-05-30: qty 250

## 2019-05-30 MED ORDER — SODIUM CHLORIDE 0.9% FLUSH: 10.0000 mL | INTRAVENOUS | Status: DC | PRN

## 2019-05-30 MED ORDER — HEPARIN BOLUS VIA INFUSION
3500.0000 [IU] | Freq: Once | INTRAVENOUS | Status: AC
  Administered 2019-05-30: 3500 [IU] via INTRAVENOUS
  Filled 2019-05-30: qty 3500

## 2019-05-30 MED ORDER — HEPARIN (PORCINE) 25000 UT/250ML-% IV SOLN
1100.0000 [IU]/h | INTRAVENOUS | Status: DC
  Administered 2019-05-30: 1100 [IU]/h via INTRAVENOUS
  Filled 2019-05-30 (×2): qty 250

## 2019-05-30 MED ORDER — GADOBUTROL 1 MMOL/ML IV SOLN
10.0000 mL | Freq: Once | INTRAVENOUS | Status: AC | PRN
  Administered 2019-05-30: 10 mL via INTRAVENOUS

## 2019-05-30 MED ORDER — SODIUM CHLORIDE 0.9% FLUSH
10.0000 mL | Freq: Two times a day (BID) | INTRAVENOUS | Status: DC
  Administered 2019-06-01 – 2019-06-05 (×6): 10 mL

## 2019-05-30 NOTE — Progress Notes (Signed)
VASCULAR LAB PRELIMINARY  PRELIMINARY  PRELIMINARY  PRELIMINARY  Bilateral lower extremity venous duplex completed.    Preliminary report:  See CV proc for preliminary results.   Messaged Dr. Ginny Forth, Wenceslao Loper, RVT 05/30/2019, 10:08 AM

## 2019-05-30 NOTE — Progress Notes (Signed)
[place] for Heparin Indication: DVT  Allergies  Allergen Reactions  . Influenza Vaccines Other (See Comments)    PT STATES SHE GETS THE FLU FROM THE VACCINE    Patient Measurements: Height: 5\' 2"  (157.5 cm) Weight: 81.2 kg (179 lb) IBW/kg (Calculated) : 50.1 Heparin Dosing Weight: 68.2 kg  Vital Signs: Temp: 98.6 F (37 C) (04/04 2030) Temp Source: Oral (04/04 2030) BP: 155/59 (04/04 2032) Pulse Rate: 77 (04/04 2032)  Labs: Recent Labs    05/29/19 1238 05/30/19 0939 05/30/19 2300  HGB 11.8* 9.7*  --   HCT 36.6 30.6*  --   PLT 372 291  --   LABPROT 13.2  --   --   INR 1.0  --   --   HEPARINUNFRC  --   --  1.10*  CREATININE 0.65 0.47  --     Estimated Creatinine Clearance: 59 mL/min (by C-G formula based on SCr of 0.47 mg/dL).   Medical History: Past Medical History:  Diagnosis Date  . Cancer ([place])   . DM (diabetes mellitus) ([place])   . Seizures ([place])   . Uterine cancer ([facility])     Medications:  Scheduled:  . insulin aspart  0-15 Units Subcutaneous Q4H  . insulin glargine  12 Units Subcutaneous QHS  . sodium chloride flush  10-40 mL Intracatheter Q12H   Infusions:  . sodium chloride 100 mL/hr at 05/30/19 0608  . [START ON 05/31/2019] heparin    . levETIRAcetam 500 mg (05/30/19 1728)  . piperacillin-tazobactam (ZOSYN)  IV 3.375 g (05/30/19 2308)    Assessment: 55 yoF admitted for elevated liver enzymes. Pt was recently admitted to [facility] 3/12-3/22 for bowel obstruction and found to have bilateral DVT. Pt was sent home on Eliquis starter pack; however, on 3/30 outpt MD Dr. Laurann Montana instructed pt to stop the medication, so pt has not been on anticoagulation since then. During this admission, a repeat Doppler was done showing age-indeterminate DVT in left leg. Pt is scheduled for ERCP on 4/5. Pharmacy consulted to dose heparin.   Pt was on enoxaparin 40 mg daily for VTE prophylaxis, last dose on 4/3. 5 days since pt's  last Eliquis dose, ok to monitor with anti-Xa level. Hgb low 9.7, plt wnl. Renal function stable, SCr 0.47.   4/4 PM update:  Heparin level is above goal No issues per RN  Goal of Therapy:  Heparin level 0.3-0.7 units/ml Monitor platelets by anticoagulation protocol: Yes   Plan:  Hold heparin x 1 hr Re-start heparin drip at 900 units/hr at 0030 0900 heparin level   Narda Bonds, PharmD, [place] Pharmacist Phone: [phone]

## 2019-05-30 NOTE — Progress Notes (Signed)
ANTICOAGULATION CONSULT NOTE - Initial Consult  Pharmacy Consult for heparin Indication: DVT  Allergies  Allergen Reactions  . Influenza Vaccines Other (See Comments)    PT STATES SHE GETS THE FLU FROM THE VACCINE    Patient Measurements: Height: 5\' 2"  (157.5 cm) Weight: 81.2 kg (179 lb) IBW/kg (Calculated) : 50.1 Heparin Dosing Weight: 68.2 kg  Vital Signs: Temp: 98.2 F (36.8 C) (04/04 1150) Temp Source: Oral (04/04 1150) BP: 105/47 (04/04 1150) Pulse Rate: 73 (04/04 1150)  Labs: Recent Labs    05/29/19 1238 05/30/19 0939  HGB 11.8* 9.7*  HCT 36.6 30.6*  PLT 372 291  LABPROT 13.2  --   INR 1.0  --   CREATININE 0.65 0.47    Estimated Creatinine Clearance: 59 mL/min (by C-G formula based on SCr of 0.47 mg/dL).   Medical History: Past Medical History:  Diagnosis Date  . Cancer ([place])   . DM (diabetes mellitus) ([place])   . Seizures ([place])   . Uterine cancer ([facility])     Medications:  Scheduled:  . enoxaparin (LOVENOX) injection  40 mg Subcutaneous Q24H  . insulin aspart  0-15 Units Subcutaneous Q4H  . insulin glargine  12 Units Subcutaneous QHS   Infusions:  . sodium chloride 100 mL/hr at 05/30/19 0608  . levETIRAcetam 500 mg (05/30/19 0610)  . piperacillin-tazobactam (ZOSYN)  IV 3.375 g (05/30/19 0813)    Assessment: 16 yoF admitted for elevated liver enzymes. Pt was recently admitted to [facility] 3/12-3/22 for bowel obstruction and found to have bilateral DVT. Pt was sent home on Eliquis starter pack; however, on 3/30 outpt MD Dr. Laurann [place] instructed pt to stop the medication, so pt has not been on anticoagulation since then. During this admission, a repeat Doppler was done showing age-indeterminate DVT in left leg. Pt is scheduled for ERCP on 4/5. Pharmacy consulted to dose heparin.   Pt was on enoxaparin 40 mg daily for VTE prophylaxis, last dose on 4/3. 5 days since pt's last Eliquis dose, ok to monitor with anti-Xa level. Hgb low 9.7, plt wnl. Renal  function stable, SCr 0.47.   Goal of Therapy:  Heparin level 0.3-0.7 units/ml Monitor platelets by anticoagulation protocol: Yes   Plan:  Heparin bolus 3500 units Heparin infusion at 1100 units/hr (~16 units/kg/hr) Heparin level in 8 hours F/u daily heparin level, CBC, s/sx of bleeding F/u resume Eliquis when done with GI procedures  Berenice Bouton, PharmD PGY1 Pharmacy Resident  Please check AMION for all [place] phone numbers After 10:00 PM, call [place] [phone]  05/30/2019,12:56 PM

## 2019-05-30 NOTE — Consult Note (Signed)
Reason for Consult: Obstructive jaundice Referring Physician: Hospital team  Tracy Odonnell is an 79 y.o. female.  HPI: Patient seen and examined and her case discussed with her relative as well and our office computer chart reviewed in her hospital computer chart reviewed and she noticed dark urine about the time she presented to [facility] on 11 March with a presumed adhesional bowel obstruction which required NG tube and her liver tests were elevated then and her primary care doctor was in the midst of working up the problem and he was about to order an MRI when she had increased pain and presented to the emergency room and we were consulted for further work-up and plans.  She did have an open cholecystectomy years ago and has had multiple gynecologic surgeries but had no GI complaints until early March as above and was also diagnosed with a DVT at [facility] as well and was started on Eliquis  Past Medical History:  Diagnosis Date  . Cancer ([place])   . DM (diabetes mellitus) ([place])   . Seizures ([place])   . Uterine cancer [facility])     Past Surgical History:  Procedure Laterality Date  . ABDOMINAL HYSTERECTOMY    . COLONOSCOPY WITH PROPOFOL N/A 07/30/2016   Procedure: COLONOSCOPY WITH PROPOFOL;  Surgeon: Garlan Fair, MD;  Location: [facility] ENDOSCOPY;  Service: Endoscopy;  Laterality: N/A;  . TONSILLECTOMY      History reviewed. No pertinent family history.  Social History:  reports that she has never smoked. She has never used smokeless tobacco. She reports that she does not drink alcohol or use drugs.  Allergies:  Allergies  Allergen Reactions  . Influenza Vaccines Other (See Comments)    PT STATES SHE GETS THE FLU FROM THE VACCINE    Medications: I have reviewed the patient's current medications.  Results for orders placed or performed during the hospital encounter of 05/29/19 (from the past 48 hour(s))  CBC with Differential     Status: Abnormal   Collection Time: 05/29/19  12:38 PM  Result Value Ref Range   WBC 10.6 (H) 4.0 - 10.5 K/uL   RBC 3.85 (L) 3.87 - 5.11 MIL/uL   Hemoglobin 11.8 (L) 12.0 - 15.0 g/dL   HCT 36.6 36.0 - 46.0 %   MCV 95.1 80.0 - 100.0 fL   MCH 30.6 26.0 - 34.0 pg   MCHC 32.2 30.0 - 36.0 g/dL   RDW 16.9 (H) 11.5 - 15.5 %   Platelets 372 150 - 400 K/uL   nRBC 0.0 0.0 - 0.2 %   Neutrophils Relative % 91 %   Neutro Abs 9.7 (H) 1.7 - 7.7 K/uL   Lymphocytes Relative 4 %   Lymphs Abs 0.4 (L) 0.7 - 4.0 K/uL   Monocytes Relative 4 %   Monocytes Absolute 0.5 0.1 - 1.0 K/uL   Eosinophils Relative 0 %   Eosinophils Absolute 0.0 0.0 - 0.5 K/uL   Basophils Relative 0 %   Basophils Absolute 0.0 0.0 - 0.1 K/uL   Immature Granulocytes 1 %   Abs Immature Granulocytes 0.05 0.00 - 0.07 K/uL    Comment: Performed at [facility], [address], [place], [place] [address]  Comprehensive metabolic panel     Status: Abnormal   Collection Time: 05/29/19 12:38 PM  Result Value Ref Range   Sodium 138 135 - 145 mmol/L   Potassium 4.1 3.5 - 5.1 mmol/L   Chloride 92 (L) 98 - 111 mmol/L  CO2 29 22 - 32 mmol/L   Glucose, Bld 320 (H) 70 - 99 mg/dL    Comment: Glucose reference range applies only to samples taken after fasting for at least 8 hours.   BUN 16 8 - 23 mg/dL   Creatinine, Ser 0.65 0.44 - 1.00 mg/dL   Calcium 9.7 8.9 - 10.3 mg/dL   Total Protein 6.9 6.5 - 8.1 g/dL   Albumin 3.3 (L) 3.5 - 5.0 g/dL   AST 393 (H) 15 - 41 U/L   ALT 444 (H) 0 - 44 U/L   Alkaline Phosphatase 782 (H) 38 - 126 U/L   Total Bilirubin 4.1 (H) 0.3 - 1.2 mg/dL   GFR calc non Af Amer >60 >60 mL/min   GFR calc Af Amer >60 >60 mL/min   Anion gap 17 (H) 5 - 15    Comment: Performed at [facility] [address], [place], [place] [address]  Lipase, blood     Status: None   Collection Time: 05/29/19 12:38 PM  Result Value Ref Range   Lipase 40 11 - 51 U/L    Comment: Performed at [facility], [place] [address], [place], [place] [address]  Ammonia      Status: Abnormal   Collection Time: 05/29/19 12:38 PM  Result Value Ref Range   Ammonia 36 (H) 9 - 35 umol/L    Comment: Performed at [facility], [place] [address], [place], [place] [address]  Protime-INR     Status: None   Collection Time: 05/29/19 12:38 PM  Result Value Ref Range   Prothrombin Time 13.2 11.4 - 15.2 seconds   INR 1.0 0.8 - 1.2    Comment: (NOTE) INR goal varies based on device and disease states. Performed at [facility], [place] [address], [place], [place] [address]   SARS CORONAVIRUS 2 (TAT 6-24 HRS) Nasopharyngeal Nasopharyngeal Swab     Status: None   Collection Time: 05/29/19  4:39 PM   Specimen: Nasopharyngeal Swab  Result Value Ref Range   SARS Coronavirus 2 NEGATIVE NEGATIVE    Comment: (NOTE) SARS-CoV-2 target nucleic acids are NOT DETECTED. The SARS-CoV-2 RNA is generally detectable in upper and lower respiratory specimens during the acute phase of infection. Negative results do not preclude SARS-CoV-2 infection, do not rule out co-infections with other pathogens, and should not be used as the sole basis for treatment or other patient management decisions. Negative results must be combined with clinical observations, patient history, and epidemiological information. The expected result is Negative. Fact Sheet for Patients: SugarRoll.be Fact Sheet for Healthcare Providers: https://www.woods-mathews.com/ This test is not yet approved or cleared by the [place] FDA and  has been authorized for detection and/or diagnosis of SARS-CoV-2 by FDA under an Emergency Use Authorization (EUA). This EUA will remain  in effect (meaning this test can be used) for the duration of the COVID-19 declaration under Section 56 4(b)(1) of the Act, 21 U.S.C. section 360bbb-3(b)(1), unless the authorization is terminated or revoked sooner. Performed at [facility], [place] [address] [place], [place] [address]    Magnesium     Status: None   Collection Time: 05/29/19  4:48 PM  Result Value Ref Range   Magnesium 2.0 1.7 - 2.4 mg/dL    Comment: Performed at [facility], [place] [address], [place], [place] [address]  Culture, blood (routine x 2)     Status: None (Preliminary result)   Collection Time: 05/29/19  4:48 PM   Specimen: BLOOD  Result Value Ref Range   Specimen Description BLOOD RIGHT ARM    Special Requests      BOTTLES DRAWN AEROBIC AND ANAEROBIC Blood Culture adequate volume   Culture      NO GROWTH < 12 HOURS Performed at [facility], [place] [address] [place], [place] [address]    Report Status PENDING   Hepatitis panel, acute     Status: None   Collection Time: 05/29/19  4:48 PM  Result Value Ref Range   Hepatitis B Surface Ag NON REACTIVE NON REACTIVE   HCV Ab NON REACTIVE NON REACTIVE    Comment: (NOTE) Nonreactive HCV antibody screen is consistent with no HCV infections,  unless recent infection is suspected or other evidence exists to indicate HCV infection.    Hep A IgM NON REACTIVE NON REACTIVE   Hep B C IgM NON REACTIVE NON REACTIVE    Comment: Performed at [facility], [place] [address], [place], [place] [address]  Hemoglobin A1c     Status: Abnormal   Collection Time: 05/29/19  4:48 PM  Result Value Ref Range   Hgb A1c MFr Bld 7.7 (H) 4.8 - 5.6 %    Comment: (NOTE) Pre diabetes:          5.7%-6.4% Diabetes:              >6.4% Glycemic control for   <7.0% adults with diabetes    Mean Plasma Glucose 174.29 mg/dL    Comment: Performed at [place] [address], [place], [place] [address]  Glucose, capillary     Status: Abnormal   Collection Time: 05/29/19  5:14 PM  Result Value Ref Range   Glucose-Capillary 255 (H) 70 - 99 mg/dL    Comment: Glucose reference range applies only to samples taken after fasting for at least 8 hours.  Lactic acid, plasma     Status: None   Collection Time: 05/29/19  6:23 PM  Result Value Ref Range    Lactic Acid, Venous 1.1 0.5 - 1.9 mmol/L    Comment: Performed at [facility], [place] [address] [place], [place] [address]  Culture, blood (routine x 2)     Status: None (Preliminary result)   Collection Time: 05/29/19  6:23 PM   Specimen: BLOOD  Result Value Ref Range   Specimen Description BLOOD RIGHT ARM    Special Requests      BOTTLES DRAWN AEROBIC ONLY Blood Culture adequate volume   Culture      NO GROWTH < 12 HOURS Performed at [facility], [place] [address] [place], [place] [address]    Report Status PENDING   Glucose, capillary     Status: Abnormal   Collection Time: 05/29/19  8:10 PM  Result Value Ref Range   Glucose-Capillary 185 (H) 70 - 99 mg/dL    Comment: Glucose reference range applies only to samples taken after fasting for at least 8 hours.  Urinalysis, Routine w reflex microscopic     Status: Abnormal   Collection Time: 05/29/19 10:25 PM  Result Value Ref Range   Color, Urine AMBER (A) YELLOW    Comment: BIOCHEMICALS MAY BE AFFECTED BY COLOR   APPearance CLEAR CLEAR   Specific Gravity, Urine >1.046 (H) 1.005 - 1.030   pH 5.0 5.0 - 8.0   Glucose, UA >=500 (A) NEGATIVE mg/dL   Hgb urine dipstick NEGATIVE NEGATIVE   Bilirubin Urine NEGATIVE  NEGATIVE   Ketones, ur 80 (A) NEGATIVE mg/dL   Protein, ur NEGATIVE NEGATIVE mg/dL   Nitrite NEGATIVE NEGATIVE   Leukocytes,Ua TRACE (A) NEGATIVE   RBC / HPF 0-5 0 - 5 RBC/hpf   WBC, UA 6-10 0 - 5 WBC/hpf   Bacteria, UA NONE SEEN NONE SEEN   Squamous Epithelial / LPF 0-5 0 - 5   Mucus PRESENT     Comment: Performed at [facility], [place] [address], [place], [place] [address]  Glucose, capillary     Status: Abnormal   Collection Time: 05/30/19 12:21 AM  Result Value Ref Range   Glucose-Capillary 182 (H) 70 - 99 mg/dL    Comment: Glucose reference range applies only to samples taken after fasting for at least 8 hours.  Glucose, capillary     Status: Abnormal   Collection Time: 05/30/19  2:57 AM  Result  Value Ref Range   Glucose-Capillary 162 (H) 70 - 99 mg/dL    Comment: Glucose reference range applies only to samples taken after fasting for at least 8 hours.  Glucose, capillary     Status: Abnormal   Collection Time: 05/30/19  8:43 AM  Result Value Ref Range   Glucose-Capillary 122 (H) 70 - 99 mg/dL    Comment: Glucose reference range applies only to samples taken after fasting for at least 8 hours.    CT Head Wo Contrast  Result Date: 05/29/2019 CLINICAL DATA:  Pain following fall EXAM: CT HEAD WITHOUT CONTRAST TECHNIQUE: Contiguous axial images were obtained from the base of the skull through the vertex without intravenous contrast. COMPARISON:  February 25, 2017 FINDINGS: Brain: Ventricles and sulci are normal in size and configuration. There is no intracranial mass, hemorrhage, extra-axial fluid collection, or midline shift. There is mild small vessel disease in the centra semiovale, stable. No acute infarct evident. Vascular: No hyperdense vessel. There is calcification in each carotid siphon region. Skull: Bony calvarium a appears intact. Sinuses/Orbits: There is mucosal thickening in several ethmoid air cells. Other visualized paranasal sinuses are clear. Orbits appear symmetric bilaterally. Other: Mastoid air cells are clear. IMPRESSION: Mild periventricular small vessel disease. No acute infarct. No mass or hemorrhage. There are foci of arterial vascular calcification. Mucosal thickening noted in several ethmoid air cells. Electronically Signed   By: Lowella Grip III M.D.   On: 05/29/2019 15:07   CT Abdomen Pelvis W Contrast  Result Date: 05/29/2019 CLINICAL DATA:  Nausea vomiting. Reported history of a bowel obstruction. Mechanical fall last night. EXAM: CT ABDOMEN AND PELVIS WITH CONTRAST TECHNIQUE: Multidetector CT imaging of the abdomen and pelvis was performed using the standard protocol following bolus administration of intravenous contrast. CONTRAST:  137mL OMNIPAQUE IOHEXOL 300  MG/ML  SOLN COMPARISON:  CT, 05/07/2019 FINDINGS: Lower chest: No acute abnormality. Hepatobiliary: Liver normal in size. No mass or focal lesion. There is significant intrahepatic bile duct dilation. Common hepatic duct measures 13 mm in diameter. Common bile duct appears normal in caliber. Duct dilation was present on the prior CT, but appears increased. Status post cholecystectomy. Pancreas: Atrophy with irregular dilation of the pancreatic duct, from the body through the tail, but no defined mass and no inflammation. Spleen: Normal in size without focal abnormality. Adrenals/Urinary Tract: Adrenal glands are unremarkable. Kidneys are normal, without renal calculi, focal lesion, or hydronephrosis. Bladder is unremarkable. Stomach/Bowel: Normal stomach. Small bowel and colon are normal in caliber. No wall thickening. No inflammation. There are diverticula most evident along the sigmoid colon. No  diverticulitis. No evidence of appendicitis Vascular/Lymphatic: Mild aortic atherosclerosis. No aneurysm. No enlarged lymph nodes. Reproductive: Status post hysterectomy. No adnexal masses. Other: Midline anterior abdominal wall incision. No hernia. Trace fluid adjacent of the liver. Musculoskeletal: No fracture or acute finding. No osteoblastic or osteolytic lesions. IMPRESSION: 1. Intrahepatic bile duct dilation extending to the common hepatic duct, increased in severity compared to the prior CT. Etiology of this is unclear. No pancreatic mass. No visualized duct stone. Given the low sensitivity of CT for choledocholithiasis, recommend follow-up MRCP or ERCP. Trace ascites adjacent to the liver. 2. No other evidence of an acute or recent abnormality. No evidence of bowel obstruction or inflammation. Electronically Signed   By: Lajean Manes M.D.   On: 05/29/2019 15:12    Review of Systems negative except above Blood pressure (!) 164/63, pulse 84, temperature 98.4 F (36.9 C), resp. rate 17, height 5\' 2"  (1.575 m),  weight 81.2 kg, SpO2 99 %. Physical Exam Vital signs stable afebrile no acute distress she does look a little jaundice exam pertinent for her abdomen being soft nontender occasional bowel sounds labs reviewed MRI reviewed final report pending expect either stone or questionable tumor in mid CBD based on my quick evaluation Assessment/Plan: Obstructive jaundice Plan: The risk benefits methods and success rate of ERCP including stenting stone extraction was discussed with the patient and my partner Dr. Therisa Doyne will probably proceed tomorrow and we also discussed an EUS which she might need and possibly even spyglass with lithotripsy which we also discussed particularly if this is a large stone and those can probably be set up as an outpatient but will wait on the final MRCP report to decide and will allow clear liquids in the meantime  Carvell Hoeffner E 05/30/2019, 9:47 AM

## 2019-05-30 NOTE — Progress Notes (Signed)
PROGRESS NOTE    Tracy Odonnell  [id number] DOB: [birth year] DOA: 05/29/2019 PCP: Lavone Orn, MD   Brief Narrative:  HPI on 05/29/2018 by Dr. Early Osmond Tracy Odonnell is a 79 y.o. female with medical history significant of insulin-dependent diabetes mellitus, seizure, uterine cancer status post hysterectomy, obesity presents to emergency department due to right upper quadrant pain and vomiting since 2 days.  Vomitus is bilious, dark brown/green in color, copious amount, too many episodes to count, associated with right upper quadrant pain, generalized pruritus, decreased appetite.  No history of dysuria, hematuria, fever, hematemesis, melena.  Reports constipation for which she has been taking stool softeners with little to no help.  She tells me that last night she fell out of her bed trying to put some lotion on her nightstand.  States that she did strike her face against the nightstand.  Denies loss of consciousness.  Assessment & Plan   Obstructive jaundice with elevated LFTs -Patient with history of cholecystectomy -CT abdomen pelvis showed intrahepatic bile duct dilation extending into the common hepatic duct -MRCP showed severe intra and extrahepatic biliary ductal dilatation to the pancreatic head, with severe dilatation of the pancreatic duct in the pancreatic neck and more distally.  Constellation of findings suspicious for pancreatic adenocarcinoma. -Gastroenterology consulted and appreciated-planning for ERCP -Continue to monitor LFTs -Continue IV fluids and Zosyn  Diabetes mellitus, type II -Hemoglobin A1c 7.7 -Continue Lantus, insulin sliding scale and CBG monitoring  Elevated anion gap -Possibly be secondary to the above -Resolved  Elevated blood pressure without history of hypertension -Possibly secondary to pain -Continue labetalol as needed  Seizure disorder -continue Keppra cautions  History of bilateral lower extremity DVT -Lower extremity Doppler  showed age-indeterminate DVT -Doppler on 05/16/2019 showed acute DVT of the right and left posterior tibial veins-patient was placed on Eliquis -Recently, patient discontinued Eliquis at the recommendation of her PCP, unsure as to why. -Place patient on heparin  Mechanical fall -Patient with left eye bruising -CT head unremarkable for acute finding -Continue fall -PT consulted  Chronic normocytic anemia -last hemoglobin 9.8 on 05/13/2019 -Continue to monitor CBC  DVT Prophylaxis  heparin  Code Status: Full  Family Communication: Cousin at bedside  Disposition Plan: Admitted from home for obstructive jaundice.  Pending further gastroenterology recommendations and work-up.  Disposition likely home.  Consultants Gastroenterology  Procedures  MRCP  Antibiotics   Anti-infectives (From admission, onward)   Start     Dose/Rate Route Frequency Ordered Stop   05/29/19 1700  piperacillin-tazobactam (ZOSYN) IVPB 3.375 g     3.375 g 12.5 mL/hr over 240 Minutes Intravenous Every 8 hours 05/29/19 1652        Subjective:   Tracy Odonnell seen and examined today.  Patient denies current abdominal pain, nausea or vomiting, chest pain, shortness of breath, dizziness or headache.  Feels that her skin has been getting progressively more jaundiced over the last couple of weeks.  Objective:   Vitals:   05/29/19 2013 05/30/19 0313 05/30/19 0549 05/30/19 1150  BP: (!) 148/58 (!) 127/57 (!) 164/63 (!) 105/47  Pulse: 81 80 84 73  Resp: 17 17 17 18   Temp: 98.6 F (37 C) 98.4 F (36.9 C) 98.4 F (36.9 C) 98.2 F (36.8 C)  TempSrc:  Oral  Oral  SpO2: 98% 97% 99% 97%  Weight:      Height:        Intake/Output Summary (Last 24 hours) at 05/30/2019 1252 Last data filed at 05/30/2019 1039  Gross per 24 hour  Intake 240 ml  Output --  Net 240 ml   Filed Weights   05/29/19 1224  Weight: 81.2 kg    Exam  General: Well developed, well nourished, NAD, appears stated age  74: NCAT,  PERRLA, EOMI, icteric sclera, mucous membranes moist.   Cardiovascular: S1 S2 auscultated, RRR, no murmur  Respiratory: Clear to auscultation bilaterally with equal chest rise  Abdomen: Soft, nontender, nondistended, + bowel sounds  Extremities: warm dry without cyanosis clubbing or edema  Neuro: AAOx3, nonfocal  Skin: Without rashes exudates or nodules, jaundice  Psych: Normal affect and demeanor with intact judgement and insight   Data Reviewed: I have personally reviewed following labs and imaging studies  CBC: Recent Labs  Lab 05/29/19 1238 05/30/19 0939  WBC 10.6* 9.6  NEUTROABS 9.7*  --   HGB 11.8* 9.7*  HCT 36.6 30.6*  MCV 95.1 98.4  PLT 372 [id number]   Basic Metabolic Panel: Recent Labs  Lab 05/29/19 1238 05/29/19 1648 05/30/19 0939  NA 138  --  143  K 4.1  --  3.8  CL 92*  --  103  CO2 29  --  30  GLUCOSE 320*  --  153*  BUN 16  --  13  CREATININE 0.65  --  0.47  CALCIUM 9.7  --  8.7*  MG  --  2.0  --    GFR: Estimated Creatinine Clearance: 59 mL/min (by C-G formula based on SCr of 0.47 mg/dL). Liver Function Tests: Recent Labs  Lab 05/29/19 1238 05/30/19 0939  AST 393* 312*  ALT 444* 351*  ALKPHOS 782* 562*  BILITOT 4.1* 2.7*  PROT 6.9 5.1*  ALBUMIN 3.3* 2.5*   Recent Labs  Lab 05/29/19 1238  LIPASE 40   Recent Labs  Lab 05/29/19 1238  AMMONIA 36*   Coagulation Profile: Recent Labs  Lab 05/29/19 1238  INR 1.0   Cardiac Enzymes: No results for input(s): CKTOTAL, CKMB, CKMBINDEX, TROPONINI in the last 168 hours. BNP (last 3 results) No results for input(s): PROBNP in the last 8760 hours. HbA1C: Recent Labs    05/29/19 1648  HGBA1C 7.7*   CBG: Recent Labs  Lab 05/29/19 2010 05/30/19 0021 05/30/19 0257 05/30/19 0843 05/30/19 1158  GLUCAP 185* 182* 162* 122* 100*   Lipid Profile: No results for input(s): CHOL, HDL, LDLCALC, TRIG, CHOLHDL, LDLDIRECT in the last 72 hours. Thyroid Function Tests: No results for input(s):  TSH, T4TOTAL, FREET4, T3FREE, THYROIDAB in the last 72 hours. Anemia Panel: No results for input(s): VITAMINB12, FOLATE, FERRITIN, TIBC, IRON, RETICCTPCT in the last 72 hours. Urine analysis:    Component Value Date/Time   COLORURINE AMBER (A) 05/29/2019 2225   APPEARANCEUR CLEAR 05/29/2019 2225   LABSPEC >1.046 (H) 05/29/2019 2225   PHURINE 5.0 05/29/2019 2225   GLUCOSEU >=500 (A) 05/29/2019 2225   HGBUR NEGATIVE 05/29/2019 2225   BILIRUBINUR NEGATIVE 05/29/2019 2225   KETONESUR 80 (A) 05/29/2019 2225   PROTEINUR NEGATIVE 05/29/2019 2225   NITRITE NEGATIVE 05/29/2019 2225   LEUKOCYTESUR TRACE (A) 05/29/2019 2225   Sepsis Labs: @LABRCNTIP (procalcitonin:4,lacticidven:4)  ) Recent Results (from the past 240 hour(s))  SARS CORONAVIRUS 2 (TAT 6-24 HRS) Nasopharyngeal Nasopharyngeal Swab     Status: None   Collection Time: 05/29/19  4:39 PM   Specimen: Nasopharyngeal Swab  Result Value Ref Range Status   SARS Coronavirus 2 NEGATIVE NEGATIVE Final    Comment: (NOTE) SARS-CoV-2 target nucleic acids are NOT DETECTED. The SARS-CoV-2 RNA is generally  detectable in upper and lower respiratory specimens during the acute phase of infection. Negative results do not preclude SARS-CoV-2 infection, do not rule out co-infections with other pathogens, and should not be used as the sole basis for treatment or other patient management decisions. Negative results must be combined with clinical observations, patient history, and epidemiological information. The expected result is Negative. Fact Sheet for Patients: SugarRoll.be Fact Sheet for Healthcare Providers: https://www.woods-mathews.com/ This test is not yet approved or cleared by the [place] FDA and  has been authorized for detection and/or diagnosis of SARS-CoV-2 by FDA under an Emergency Use Authorization (EUA). This EUA will remain  in effect (meaning this test can be used) for the  duration of the COVID-19 declaration under Section 56 4(b)(1) of the Act, 21 U.S.C. section 360bbb-3(b)(1), unless the authorization is terminated or revoked sooner. Performed at [facility], [place] [address] [place], [place] [address]   Culture, blood (routine x 2)     Status: None (Preliminary result)   Collection Time: 05/29/19  4:48 PM   Specimen: BLOOD  Result Value Ref Range Status   Specimen Description BLOOD RIGHT ARM  Final   Special Requests   Final    BOTTLES DRAWN AEROBIC AND ANAEROBIC Blood Culture adequate volume   Culture   Final    NO GROWTH < 12 HOURS Performed at [facility], [place] [address] [place], [place] [address]    Report Status PENDING  Incomplete  Culture, blood (routine x 2)     Status: None (Preliminary result)   Collection Time: 05/29/19  6:23 PM   Specimen: BLOOD  Result Value Ref Range Status   Specimen Description BLOOD RIGHT ARM  Final   Special Requests   Final    BOTTLES DRAWN AEROBIC ONLY Blood Culture adequate volume   Culture   Final    NO GROWTH < 12 HOURS Performed at [facility], [place] [address], [place], [place] [address]    Report Status PENDING  Incomplete      Radiology Studies: CT Head Wo Contrast  Result Date: 05/29/2019 CLINICAL DATA:  Pain following fall EXAM: CT HEAD WITHOUT CONTRAST TECHNIQUE: Contiguous axial images were obtained from the base of the skull through the vertex without intravenous contrast. COMPARISON:  February 25, 2017 FINDINGS: Brain: Ventricles and sulci are normal in size and configuration. There is no intracranial mass, hemorrhage, extra-axial fluid collection, or midline shift. There is mild small vessel disease in the centra semiovale, stable. No acute infarct evident. Vascular: No hyperdense vessel. There is calcification in each carotid siphon region. Skull: Bony calvarium a appears intact. Sinuses/Orbits: There is mucosal thickening in several ethmoid air cells. Other visualized paranasal  sinuses are clear. Orbits appear symmetric bilaterally. Other: Mastoid air cells are clear. IMPRESSION: Mild periventricular small vessel disease. No acute infarct. No mass or hemorrhage. There are foci of arterial vascular calcification. Mucosal thickening noted in several ethmoid air cells. Electronically Signed   By: Lowella Grip III M.D.   On: 05/29/2019 15:07   CT Abdomen Pelvis W Contrast  Result Date: 05/29/2019 CLINICAL DATA:  Nausea vomiting. Reported history of a bowel obstruction. Mechanical fall last night. EXAM: CT ABDOMEN AND PELVIS WITH CONTRAST TECHNIQUE: Multidetector CT imaging of the abdomen and pelvis was performed using the standard protocol following bolus administration of intravenous contrast. CONTRAST:  168mL OMNIPAQUE IOHEXOL 300 MG/ML  SOLN COMPARISON:  CT, 05/07/2019 FINDINGS: Lower chest: No acute abnormality. Hepatobiliary: Liver normal in  size. No mass or focal lesion. There is significant intrahepatic bile duct dilation. Common hepatic duct measures 13 mm in diameter. Common bile duct appears normal in caliber. Duct dilation was present on the prior CT, but appears increased. Status post cholecystectomy. Pancreas: Atrophy with irregular dilation of the pancreatic duct, from the body through the tail, but no defined mass and no inflammation. Spleen: Normal in size without focal abnormality. Adrenals/Urinary Tract: Adrenal glands are unremarkable. Kidneys are normal, without renal calculi, focal lesion, or hydronephrosis. Bladder is unremarkable. Stomach/Bowel: Normal stomach. Small bowel and colon are normal in caliber. No wall thickening. No inflammation. There are diverticula most evident along the sigmoid colon. No diverticulitis. No evidence of appendicitis Vascular/Lymphatic: Mild aortic atherosclerosis. No aneurysm. No enlarged lymph nodes. Reproductive: Status post hysterectomy. No adnexal masses. Other: Midline anterior abdominal wall incision. No hernia. Trace fluid  adjacent of the liver. Musculoskeletal: No fracture or acute finding. No osteoblastic or osteolytic lesions. IMPRESSION: 1. Intrahepatic bile duct dilation extending to the common hepatic duct, increased in severity compared to the prior CT. Etiology of this is unclear. No pancreatic mass. No visualized duct stone. Given the low sensitivity of CT for choledocholithiasis, recommend follow-up MRCP or ERCP. Trace ascites adjacent to the liver. 2. No other evidence of an acute or recent abnormality. No evidence of bowel obstruction or inflammation. Electronically Signed   By: Lajean Manes M.D.   On: 05/29/2019 15:12   MR ABDOMEN MRCP W WO CONTAST  Result Date: 05/30/2019 CLINICAL DATA:  Jaundice, weight loss, biliary ductal dilatation by CT, history of bowel obstruction EXAM: MRI ABDOMEN WITHOUT AND WITH CONTRAST (INCLUDING MRCP) TECHNIQUE: Multiplanar multisequence MR imaging of the abdomen was performed both before and after the administration of intravenous contrast. Heavily T2-weighted images of the biliary and pancreatic ducts were obtained, and three-dimensional MRCP images were rendered by post processing. CONTRAST:  53mL GADAVIST GADOBUTROL 1 MMOL/ML IV SOLN COMPARISON:  CT abdomen pelvis, 05/29/2019, 05/07/2019 FINDINGS: Lower chest: No acute findings. Hepatobiliary: No mass or other parenchymal abnormality identified. There is extensive intrahepatic and extrahepatic biliary ductal dilatation, as seen on prior CT dated 05/29/2019 and significantly increased comparison to CT dated 05/07/2019, measuring up to 1.2 cm in caliber. MRCP of the central portion of the common bile duct is limited by metallic susceptibility artifact from cholecystectomy clips. Pancreas: There is severe dilatation of the pancreatic duct in the pancreatic neck and more distally, measuring up to 1.0 cm in caliber. There is no definitely visualized pancreatic mass, however there appears to be a very subtle, ill-defined lesion of the  pancreatic head with subtle soft tissue stranding about the adjacent duodenum, portal vein, superior mesenteric vein, and superior mesenteric artery (e.g. Series 4, image 17, series 34, image 47). This measures no greater than 2.5 cm. Spleen:  Within normal limits in size and appearance. Adrenals/Urinary Tract: No masses identified. No evidence of hydronephrosis. Stomach/Bowel: Visualized portions within the abdomen are unremarkable. Vascular/Lymphatic: No pathologically enlarged lymph nodes identified. No abdominal aortic aneurysm demonstrated. Other: Trace perihepatic ascites. Small omental and peritoneal nodules (series 32, image 58, 49). Musculoskeletal: No suspicious bone lesions identified. IMPRESSION: 1. 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 There appears to be a very subtle, ill-defined lesion in the pancreatic head with soft tissue stranding about the adjacent duodenum, portal vein, superior mesenteric vein, and superior mesenteric artery measuring no greater than 2.5  cm. 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 Consider ERCP for tissue sampling. 2. Trace perihepatic ascites with small omental and peritoneal nodules, highly suspicious for peritoneal metastatic disease. Electronically Signed   By: Eddie Candle M.D.   On: 05/30/2019 10:19   VAS [place] LOWER EXTREMITY VENOUS (DVT)  Result Date: 05/30/2019  Lower Venous DVT Study Indications: Swelling.  Risk Factors: Recently diagnosed with bilateral lower extremity calf DVT at [facility], patient only took Eliquis for approximately 1 week before discontinuing medicine herself. Limitations: Patient's pain and holding of breath with compression,. Performing Technologist: Sharion Dove RVS  Examination Guidelines: A complete evaluation includes B-mode imaging, spectral Doppler, color Doppler, and  power Doppler as needed of all accessible portions of each vessel. Bilateral testing is considered an integral part of a complete examination. Limited examinations for reoccurring indications may be performed as noted. The reflux portion of the exam is performed with the patient in reverse Trendelenburg.  +---------+---------------+---------+-----------+----------+--------------+ RIGHT    CompressibilityPhasicitySpontaneityPropertiesThrombus Aging +---------+---------------+---------+-----------+----------+--------------+ CFV      Full           Yes      Yes                                 +---------+---------------+---------+-----------+----------+--------------+ SFJ      Full                                                        +---------+---------------+---------+-----------+----------+--------------+ FV Prox  Full                                                        +---------+---------------+---------+-----------+----------+--------------+ FV Mid   Full                                                        +---------+---------------+---------+-----------+----------+--------------+ FV DistalFull                                                        +---------+---------------+---------+-----------+----------+--------------+ PFV      Full                                                        +---------+---------------+---------+-----------+----------+--------------+ POP      Full           Yes      Yes                                 +---------+---------------+---------+-----------+----------+--------------+ PTV      Full                                                        +---------+---------------+---------+-----------+----------+--------------+  PERO     Full                                                        +---------+---------------+---------+-----------+----------+--------------+    +---------+---------------+---------+-----------+----------+-----------------+ LEFT     CompressibilityPhasicitySpontaneityPropertiesThrombus Aging    +---------+---------------+---------+-----------+----------+-----------------+ CFV      Full           Yes      Yes                                    +---------+---------------+---------+-----------+----------+-----------------+ SFJ      Full                                                           +---------+---------------+---------+-----------+----------+-----------------+ FV Prox  Full                                                           +---------+---------------+---------+-----------+----------+-----------------+ FV Mid   Full                                                           +---------+---------------+---------+-----------+----------+-----------------+ FV DistalFull                                                           +---------+---------------+---------+-----------+----------+-----------------+ PFV      Full                                                           +---------+---------------+---------+-----------+----------+-----------------+ POP      Full           Yes      Yes                                    +---------+---------------+---------+-----------+----------+-----------------+ PTV      Partial                                      Age Indeterminate +---------+---------------+---------+-----------+----------+-----------------+ PERO     Partial  Age Indeterminate +---------+---------------+---------+-----------+----------+-----------------+     Summary: RIGHT: - There is no evidence of deep vein thrombosis in the lower extremity.  LEFT: - Findings consistent with age indeterminate deep vein thrombosis involving the left posterior tibial veins, and left peroneal veins.  *See table(s) above for measurements and observations.     Preliminary      Scheduled Meds: . enoxaparin (LOVENOX) injection  40 mg Subcutaneous Q24H  . insulin aspart  0-15 Units Subcutaneous Q4H  . insulin glargine  12 Units Subcutaneous QHS   Continuous Infusions: . sodium chloride 100 mL/hr at 05/30/19 0608  . levETIRAcetam 500 mg (05/30/19 0610)  . piperacillin-tazobactam (ZOSYN)  IV 3.375 g (05/30/19 0813)     LOS: 1 day   Time Spent in minutes   45 minutes  Shaqueta Casady D.O. on 05/30/2019 at 12:52 PM  Between 7am to 7pm - Please see pager noted on [email or web address]  After 7pm go to [email or web address]  And look for the night coverage person covering for me after hours  [facility] Office  [phone]

## 2019-05-30 NOTE — Progress Notes (Signed)
PT Cancellation Note  Patient Details Name: RAEWYN AUMICK MRN: [id number] DOB: 10-Mar-1940   Cancelled Treatment:    Reason Eval/Treat Not Completed: Patient at procedure or test/unavailable Pt currently getting another IV started with RNs. RN stated she had been up in the chair and to bathroom with RW without any incident. Pt asked if we could evaluate her tomorrow. PT will continue to follow and check back as time allows.  Ann Held PT, DPT Acute Rehab [facility] P: [phone]   Renato Gails 05/30/2019, 3:23 PM

## 2019-05-31 ENCOUNTER — Inpatient Hospital Stay (HOSPITAL_COMMUNITY): Payer: Medicare HMO

## 2019-05-31 LAB — CBC
HCT: 28.5 % — ABNORMAL LOW (ref 36.0–46.0)
Hemoglobin: 8.9 g/dL — ABNORMAL LOW (ref 12.0–15.0)
MCH: 30.8 pg (ref 26.0–34.0)
MCHC: 31.2 g/dL (ref 30.0–36.0)
MCV: 98.6 fL (ref 80.0–100.0)
Platelets: 272 10*3/uL (ref 150–400)
RBC: 2.89 MIL/uL — ABNORMAL LOW (ref 3.87–5.11)
RDW: 16.9 % — ABNORMAL HIGH (ref 11.5–15.5)
WBC: 6.8 10*3/uL (ref 4.0–10.5)
nRBC: 0 % (ref 0.0–0.2)

## 2019-05-31 LAB — GLUCOSE, CAPILLARY
Glucose-Capillary: 103 mg/dL — ABNORMAL HIGH (ref 70–99)
Glucose-Capillary: 90 mg/dL (ref 70–99)
Glucose-Capillary: 98 mg/dL (ref 70–99)
Glucose-Capillary: 135 mg/dL — ABNORMAL HIGH (ref 70–99)
Glucose-Capillary: 134 mg/dL — ABNORMAL HIGH (ref 70–99)

## 2019-05-31 LAB — COMPREHENSIVE METABOLIC PANEL
ALT: 289 U/L — ABNORMAL HIGH (ref 0–44)
AST: 228 U/L — ABNORMAL HIGH (ref 15–41)
Albumin: 2.2 g/dL — ABNORMAL LOW (ref 3.5–5.0)
Alkaline Phosphatase: 443 U/L — ABNORMAL HIGH (ref 38–126)
Anion gap: 9 (ref 5–15)
BUN: 9 mg/dL (ref 8–23)
CO2: 27 mmol/L (ref 22–32)
Calcium: 8.1 mg/dL — ABNORMAL LOW (ref 8.9–10.3)
Chloride: 104 mmol/L (ref 98–111)
Creatinine, Ser: 0.41 mg/dL — ABNORMAL LOW (ref 0.44–1.00)
GFR calc Af Amer: >60 mL/min (ref >60)
GFR calc non Af Amer: >60 mL/min (ref >60)
Glucose, Bld: 116 mg/dL — ABNORMAL HIGH (ref 70–99)
Potassium: 2.4 mmol/L — CL (ref 3.5–5.1)
Sodium: 140 mmol/L (ref 135–145)
Total Bilirubin: 2 mg/dL — ABNORMAL HIGH (ref 0.3–1.2)
Total Protein: 4.6 g/dL — ABNORMAL LOW (ref 6.5–8.1)

## 2019-05-31 LAB — MAGNESIUM: Magnesium: 1.8 mg/dL (ref 1.7–2.4)

## 2019-05-31 LAB — HEPARIN LEVEL (UNFRACTIONATED): Heparin Unfractionated: 0.56 IU/mL (ref 0.30–0.70)

## 2019-05-31 MED ORDER — HEPARIN (PORCINE) 25000 UT/250ML-% IV SOLN
900.0000 [IU]/h | INTRAVENOUS | Status: DC
  Administered 2019-05-31: 09:00:00 900 [IU]/h via INTRAVENOUS
  Filled 2019-05-31: qty 250

## 2019-05-31 MED ORDER — POTASSIUM CHLORIDE 10 MEQ/100ML IV SOLN
10.0000 meq | INTRAVENOUS | Status: AC
  Administered 2019-05-31 (×4): 10 meq via INTRAVENOUS
  Filled 2019-05-31 (×3): qty 100

## 2019-05-31 MED ORDER — SODIUM CHLORIDE 0.9 % IV SOLN: INTRAVENOUS | Status: DC

## 2019-05-31 MED ORDER — HEPARIN (PORCINE) 25000 UT/250ML-% IV SOLN
900.0000 [IU]/h | INTRAVENOUS | Status: AC
  Administered 2019-05-31: 900 [IU]/h via INTRAVENOUS
  Filled 2019-05-31: qty 250

## 2019-05-31 MED ORDER — GLYCERIN (LAXATIVE) 2.1 G RE SUPP
1.0000 | Freq: Once | RECTAL | Status: AC
  Administered 2019-05-31: 1 via RECTAL
  Filled 2019-05-31: qty 1

## 2019-05-31 MED ORDER — POTASSIUM CHLORIDE 10 MEQ/100ML IV SOLN
INTRAVENOUS | Status: AC
  Administered 2019-05-31: 10 meq
  Filled 2019-05-31: qty 200

## 2019-05-31 NOTE — Progress Notes (Signed)
PROGRESS NOTE    Tracy Odonnell  [id number] DOB: 1941-01-30 DOA: 05/29/2019 PCP: Lavone Orn, MD   Brief Narrative:  HPI on 05/29/2018 by Dr. Early Osmond Tracy Odonnell is a 79 y.o. female with medical history significant of insulin-dependent diabetes mellitus, seizure, uterine cancer status post hysterectomy, obesity presents to emergency department due to right upper quadrant pain and vomiting since 2 days.  Vomitus is bilious, dark brown/green in color, copious amount, too many episodes to count, associated with right upper quadrant pain, generalized pruritus, decreased appetite.  No history of dysuria, hematuria, fever, hematemesis, melena.  Reports constipation for which she has been taking stool softeners with little to no help.  She tells me that last night she fell out of her bed trying to put some lotion on her nightstand.  States that she did strike her face against the nightstand.  Denies loss of consciousness.  Interim history Admitted with obstructive jaundice and elevated LFTs.  Gastroenterology consulted and appreciated, planning for ERCP and EUS on 06/01/2019.  Assessment & Plan   Obstructive jaundice with elevated LFTs -Patient with history of cholecystectomy -CT abdomen pelvis showed intrahepatic bile duct dilation extending into the common hepatic duct -MRCP showed severe intra and extrahepatic biliary ductal dilatation to the pancreatic head, with severe dilatation of the pancreatic duct in the pancreatic neck and more distally.  Constellation of findings suspicious for pancreatic adenocarcinoma. -Gastroenterology consulted and appreciated-planning for ERCP and EUS on 06/01/2019 -Continue to monitor LFTs- trending downward -Continue IV fluids and Zosyn  Diabetes mellitus, type II -Hemoglobin A1c 7.7 -Continue Lantus, insulin sliding scale and CBG monitoring  Elevated anion gap -Possibly be secondary to the above -Resolved  Elevated blood pressure without  history of hypertension -Possibly secondary to pain -Continue labetalol as needed  Seizure disorder -continue Keppra cautions  History of bilateral lower extremity DVT -Lower extremity Doppler showed L age-indeterminate DVT of posterior tibial, peroneal veins.  Right no evidence of DVT. -Doppler on 05/16/2019 showed acute DVT of the right and left posterior tibial veins-patient was placed on Eliquis -Recently, patient discontinued Eliquis at the recommendation of her PCP, unsure as to why. -Continue heparin  Mechanical fall -Patient with left eye bruising -CT head unremarkable for acute finding -Continue fall -PT consulted and pending   Chronic normocytic anemia -last hemoglobin 9.8 on 05/13/2019 -Currently hemoglobin 8.9 -Continue to monitor CBC  Hypokalemia -Potassium 2.4, will continue to replace and monitor BMP -Magnesium obtain, 1.8  DVT Prophylaxis  heparin  Code Status: Full  Family Communication: Cousin at bedside  Disposition Plan: Admitted from home for obstructive jaundice.  Pending further gastroenterology recommendations and work-up.  Disposition likely home.  Consultants Gastroenterology  Procedures  MRCP  Antibiotics   Anti-infectives (From admission, onward)   Start     Dose/Rate Route Frequency Ordered Stop   05/29/19 1700  piperacillin-tazobactam (ZOSYN) IVPB 3.375 g     3.375 g 12.5 mL/hr over 240 Minutes Intravenous Every 8 hours 05/29/19 1652        Subjective:   Tracy Odonnell seen and examined today.  Patient continues to have nausea.  Denies current abdominal pain or vomiting at this time.  Denies chest pain, shortness of breath, dizziness or headache.    Objective:   Vitals:   05/30/19 2030 05/30/19 2032 05/31/19 0459 05/31/19 0906  BP: (!) 158/61 (!) 155/59 (!) 159/68 (!) 145/62  Pulse: 76 77 70 (!) 59  Resp:   17 18  Temp: 98.6 F (37 C)  98.4 F (36.9 C) 98.7 F (37.1 C)  TempSrc: Oral  Oral Oral  SpO2: 94% 94% 97% 99%    Weight:      Height:        Intake/Output Summary (Last 24 hours) at 05/31/2019 1049 Last data filed at 05/30/2019 2030 Gross per 24 hour  Intake 560 ml  Output 300 ml  Net 260 ml   Filed Weights   05/29/19 1224  Weight: 81.2 kg   Exam  General: Well developed, well nourished, NAD, appears stated age  63: NCAT, right orbital bruising with laceration, mucous membranes moist.  Icteric sclera.  Cardiovascular: S1 S2 auscultated, RRR, no murmur  Respiratory: Clear to auscultation bilaterally with equal chest rise  Abdomen: Soft, nontender, nondistended, + bowel sounds  Extremities: warm dry without cyanosis clubbing or edema  Neuro: AAOx3, nonfocal  Skin: No rashes, jaundice  Psych: Normal affect and demeanor with intact judgement and insight   Data Reviewed: I have personally reviewed following labs and imaging studies  CBC: Recent Labs  Lab 05/29/19 1238 05/30/19 0939 05/31/19 0411  WBC 10.6* 9.6 6.8  NEUTROABS 9.7*  --   --   HGB 11.8* 9.7* 8.9*  HCT 36.6 30.6* 28.5*  MCV 95.1 98.4 98.6  PLT 372 291 [id number]   Basic Metabolic Panel: Recent Labs  Lab 05/29/19 1238 05/29/19 1648 05/30/19 0939 05/31/19 0411 05/31/19 0656  NA 138  --  143 140  --   K 4.1  --  3.8 2.4*  --   CL 92*  --  103 104  --   CO2 29  --  30 27  --   GLUCOSE 320*  --  153* 116*  --   BUN 16  --  13 9  --   CREATININE 0.65  --  0.47 0.41*  --   CALCIUM 9.7  --  8.7* 8.1*  --   MG  --  2.0  --   --  1.8   GFR: Estimated Creatinine Clearance: 59 mL/min (A) (by C-G formula based on SCr of 0.41 mg/dL (L)). Liver Function Tests: Recent Labs  Lab 05/29/19 1238 05/30/19 0939 05/31/19 0411  AST 393* 312* 228*  ALT 444* 351* 289*  ALKPHOS 782* 562* 443*  BILITOT 4.1* 2.7* 2.0*  PROT 6.9 5.1* 4.6*  ALBUMIN 3.3* 2.5* 2.2*   Recent Labs  Lab 05/29/19 1238  LIPASE 40   Recent Labs  Lab 05/29/19 1238  AMMONIA 36*   Coagulation Profile: Recent Labs  Lab 05/29/19 1238  INR  1.0   Cardiac Enzymes: No results for input(s): CKTOTAL, CKMB, CKMBINDEX, TROPONINI in the last 168 hours. BNP (last 3 results) No results for input(s): PROBNP in the last 8760 hours. HbA1C: Recent Labs    05/29/19 1648  HGBA1C 7.7*   CBG: Recent Labs  Lab 05/30/19 1730 05/30/19 2027 05/30/19 2357 05/31/19 0516 05/31/19 0848  GLUCAP 123* 135* 162* 103* 90   Lipid Profile: No results for input(s): CHOL, HDL, LDLCALC, TRIG, CHOLHDL, LDLDIRECT in the last 72 hours. Thyroid Function Tests: No results for input(s): TSH, T4TOTAL, FREET4, T3FREE, THYROIDAB in the last 72 hours. Anemia Panel: No results for input(s): VITAMINB12, FOLATE, FERRITIN, TIBC, IRON, RETICCTPCT in the last 72 hours. Urine analysis:    Component Value Date/Time   COLORURINE AMBER (A) 05/29/2019 2225   APPEARANCEUR CLEAR 05/29/2019 2225   LABSPEC >1.046 (H) 05/29/2019 2225   PHURINE 5.0 05/29/2019 2225   GLUCOSEU >=500 (A) 05/29/2019 2225  HGBUR NEGATIVE 05/29/2019 [place] 05/29/2019 2225   KETONESUR 80 (A) 05/29/2019 2225   PROTEINUR NEGATIVE 05/29/2019 2225   NITRITE NEGATIVE 05/29/2019 2225   LEUKOCYTESUR TRACE (A) 05/29/2019 2225   Sepsis Labs: @LABRCNTIP (procalcitonin:4,lacticidven:4)  ) Recent Results (from the past 240 hour(s))  SARS CORONAVIRUS 2 (TAT 6-24 HRS) Nasopharyngeal Nasopharyngeal Swab     Status: None   Collection Time: 05/29/19  4:39 PM   Specimen: Nasopharyngeal Swab  Result Value Ref Range Status   SARS Coronavirus 2 NEGATIVE NEGATIVE Final    Comment: (NOTE) SARS-CoV-2 target nucleic acids are NOT DETECTED. The SARS-CoV-2 RNA is generally detectable in upper and lower respiratory specimens during the acute phase of infection. Negative results do not preclude SARS-CoV-2 infection, do not rule out co-infections with other pathogens, and should not be used as the sole basis for treatment or other patient management decisions. Negative results must be  combined with clinical observations, patient history, and epidemiological information. The expected result is Negative. Fact Sheet for Patients: SugarRoll.be Fact Sheet for Healthcare Providers: https://www.woods-mathews.com/ This test is not yet approved or cleared by the [place] FDA and  has been authorized for detection and/or diagnosis of SARS-CoV-2 by FDA under an Emergency Use Authorization (EUA). This EUA will remain  in effect (meaning this test can be used) for the duration of the COVID-19 declaration under Section 56 4(b)(1) of the Act, 21 U.S.C. section 360bbb-3(b)(1), unless the authorization is terminated or revoked sooner. Performed at [facility], [place] [address] [place], [place] [address]   Culture, blood (routine x 2)     Status: None (Preliminary result)   Collection Time: 05/29/19  4:48 PM   Specimen: BLOOD  Result Value Ref Range Status   Specimen Description BLOOD RIGHT ARM  Final   Special Requests   Final    BOTTLES DRAWN AEROBIC AND ANAEROBIC Blood Culture adequate volume   Culture   Final    NO GROWTH < 12 HOURS Performed at [facility], [place] [address] [place], [place] [address]    Report Status PENDING  Incomplete  Culture, blood (routine x 2)     Status: None (Preliminary result)   Collection Time: 05/29/19  6:23 PM   Specimen: BLOOD  Result Value Ref Range Status   Specimen Description BLOOD RIGHT ARM  Final   Special Requests   Final    BOTTLES DRAWN AEROBIC ONLY Blood Culture adequate volume   Culture   Final    NO GROWTH < 12 HOURS Performed at [facility], [place] [address], [place],  [address]    Report Status PENDING  Incomplete      Radiology Studies: CT Head Wo Contrast  Result Date: 05/29/2019 CLINICAL DATA:  Pain following fall EXAM: CT HEAD WITHOUT CONTRAST TECHNIQUE: Contiguous axial images were obtained from the base of the skull through the vertex without  intravenous contrast. COMPARISON:  February 25, 2017 FINDINGS: Brain: Ventricles and sulci are normal in size and configuration. There is no intracranial mass, hemorrhage, extra-axial fluid collection, or midline shift. There is mild small vessel disease in the centra semiovale, stable. No acute infarct evident. Vascular: No hyperdense vessel. There is calcification in each carotid siphon region. Skull: Bony calvarium a appears intact. Sinuses/Orbits: There is mucosal thickening in several ethmoid air cells. Other visualized paranasal sinuses are clear. Orbits appear symmetric bilaterally. Other: Mastoid air cells are clear. IMPRESSION: Mild periventricular small vessel disease. No acute infarct. No  mass or hemorrhage. There are foci of arterial vascular calcification. Mucosal thickening noted in several ethmoid air cells. Electronically Signed   By: Lowella Grip III M.D.   On: 05/29/2019 15:07   CT Abdomen Pelvis W Contrast  Result Date: 05/29/2019 CLINICAL DATA:  Nausea vomiting. Reported history of a bowel obstruction. Mechanical fall last night. EXAM: CT ABDOMEN AND PELVIS WITH CONTRAST TECHNIQUE: Multidetector CT imaging of the abdomen and pelvis was performed using the standard protocol following bolus administration of intravenous contrast. CONTRAST:  149mL OMNIPAQUE IOHEXOL 300 MG/ML  SOLN COMPARISON:  CT, 05/07/2019 FINDINGS: Lower chest: No acute abnormality. Hepatobiliary: Liver normal in size. No mass or focal lesion. There is significant intrahepatic bile duct dilation. Common hepatic duct measures 13 mm in diameter. Common bile duct appears normal in caliber. Duct dilation was present on the prior CT, but appears increased. Status post cholecystectomy. Pancreas: Atrophy with irregular dilation of the pancreatic duct, from the body through the tail, but no defined mass and no inflammation. Spleen: Normal in size without focal abnormality. Adrenals/Urinary Tract: Adrenal glands are unremarkable.  Kidneys are normal, without renal calculi, focal lesion, or hydronephrosis. Bladder is unremarkable. Stomach/Bowel: Normal stomach. Small bowel and colon are normal in caliber. No wall thickening. No inflammation. There are diverticula most evident along the sigmoid colon. No diverticulitis. No evidence of appendicitis Vascular/Lymphatic: Mild aortic atherosclerosis. No aneurysm. No enlarged lymph nodes. Reproductive: Status post hysterectomy. No adnexal masses. Other: Midline anterior abdominal wall incision. No hernia. Trace fluid adjacent of the liver. Musculoskeletal: No fracture or acute finding. No osteoblastic or osteolytic lesions. IMPRESSION: 1. Intrahepatic bile duct dilation extending to the common hepatic duct, increased in severity compared to the prior CT. Etiology of this is unclear. No pancreatic mass. No visualized duct stone. Given the low sensitivity of CT for choledocholithiasis, recommend follow-up MRCP or ERCP. Trace ascites adjacent to the liver. 2. No other evidence of an acute or recent abnormality. No evidence of bowel obstruction or inflammation. Electronically Signed   By: Lajean Manes M.D.   On: 05/29/2019 15:12   MR ABDOMEN MRCP W WO CONTAST  Result Date: 05/30/2019 CLINICAL DATA:  Jaundice, weight loss, biliary ductal dilatation by CT, history of bowel obstruction EXAM: MRI ABDOMEN WITHOUT AND WITH CONTRAST (INCLUDING MRCP) TECHNIQUE: Multiplanar multisequence MR imaging of the abdomen was performed both before and after the administration of intravenous contrast. Heavily T2-weighted images of the biliary and pancreatic ducts were obtained, and three-dimensional MRCP images were rendered by post processing. CONTRAST:  8mL GADAVIST GADOBUTROL 1 MMOL/ML IV SOLN COMPARISON:  CT abdomen pelvis, 05/29/2019, 05/07/2019 FINDINGS: Lower chest: No acute findings. Hepatobiliary: No mass or other parenchymal abnormality identified. There is extensive intrahepatic and extrahepatic biliary  ductal dilatation, as seen on prior CT dated 05/29/2019 and significantly increased comparison to CT dated 05/07/2019, measuring up to 1.2 cm in caliber. MRCP of the central portion of the common bile duct is limited by metallic susceptibility artifact from cholecystectomy clips. Pancreas: There is severe dilatation of the pancreatic duct in the pancreatic neck and more distally, measuring up to 1.0 cm in caliber. There is no definitely visualized pancreatic mass, however there appears to be a very subtle, ill-defined lesion of the pancreatic head with subtle soft tissue stranding about the adjacent duodenum, portal vein, superior mesenteric vein, and superior mesenteric artery (e.g. Series 4, image 17, series 34, image 47). This measures no greater than 2.5 cm. Spleen:  Within normal limits in size and appearance. Adrenals/Urinary  Tract: No masses identified. No evidence of hydronephrosis. Stomach/Bowel: Visualized portions within the abdomen are unremarkable. Vascular/Lymphatic: No pathologically enlarged lymph nodes identified. No abdominal aortic aneurysm demonstrated. Other: Trace perihepatic ascites. Small omental and peritoneal nodules (series 32, image 58, 49). Musculoskeletal: No suspicious bone lesions identified. IMPRESSION: 1. 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 There appears to be a very subtle, ill-defined lesion in the pancreatic head with soft tissue stranding about the adjacent duodenum, portal vein, superior mesenteric vein, and superior mesenteric artery measuring no greater than 2.5 cm. 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 Consider ERCP for tissue sampling. 2. Trace perihepatic ascites with small omental and peritoneal nodules, highly suspicious for peritoneal metastatic disease. Electronically Signed   By: Eddie Candle  M.D.   On: 05/30/2019 10:19   VAS [place] LOWER EXTREMITY VENOUS (DVT)  Result Date: 05/30/2019  Lower Venous DVT Study Indications: Swelling.  Risk Factors: Recently diagnosed with bilateral lower extremity calf DVT at [facility], patient only took Eliquis for approximately 1 week before discontinuing medicine herself. Limitations: Patient's pain and holding of breath with compression,. Performing Technologist: Sharion Dove RVS  Examination Guidelines: A complete evaluation includes B-mode imaging, spectral Doppler, color Doppler, and power Doppler as needed of all accessible portions of each vessel. Bilateral testing is considered an integral part of a complete examination. Limited examinations for reoccurring indications may be performed as noted. The reflux portion of the exam is performed with the patient in reverse Trendelenburg.  +---------+---------------+---------+-----------+----------+--------------+ RIGHT    CompressibilityPhasicitySpontaneityPropertiesThrombus Aging +---------+---------------+---------+-----------+----------+--------------+ CFV      Full           Yes      Yes                                 +---------+---------------+---------+-----------+----------+--------------+ SFJ      Full                                                        +---------+---------------+---------+-----------+----------+--------------+ FV Prox  Full                                                        +---------+---------------+---------+-----------+----------+--------------+ FV Mid   Full                                                        +---------+---------------+---------+-----------+----------+--------------+ FV DistalFull                                                        +---------+---------------+---------+-----------+----------+--------------+ PFV      Full                                                         +---------+---------------+---------+-----------+----------+--------------+  POP      Full           Yes      Yes                                 +---------+---------------+---------+-----------+----------+--------------+ PTV      Full                                                        +---------+---------------+---------+-----------+----------+--------------+ PERO     Full                                                        +---------+---------------+---------+-----------+----------+--------------+   +---------+---------------+---------+-----------+----------+-----------------+ LEFT     CompressibilityPhasicitySpontaneityPropertiesThrombus Aging    +---------+---------------+---------+-----------+----------+-----------------+ CFV      Full           Yes      Yes                                    +---------+---------------+---------+-----------+----------+-----------------+ SFJ      Full                                                           +---------+---------------+---------+-----------+----------+-----------------+ FV Prox  Full                                                           +---------+---------------+---------+-----------+----------+-----------------+ FV Mid   Full                                                           +---------+---------------+---------+-----------+----------+-----------------+ FV DistalFull                                                           +---------+---------------+---------+-----------+----------+-----------------+ PFV      Full                                                           +---------+---------------+---------+-----------+----------+-----------------+ POP      Full           Yes      Yes                                    +---------+---------------+---------+-----------+----------+-----------------+  PTV      Partial                                      Age  Indeterminate +---------+---------------+---------+-----------+----------+-----------------+ PERO     Partial                                      Age Indeterminate +---------+---------------+---------+-----------+----------+-----------------+     Summary: RIGHT: - There is no evidence of deep vein thrombosis in the lower extremity.  LEFT: - Findings consistent with age indeterminate deep vein thrombosis involving the left posterior tibial veins, and left peroneal veins.  *See table(s) above for measurements and observations.    Preliminary      Scheduled Meds: . insulin aspart  0-15 Units Subcutaneous Q4H  . insulin glargine  12 Units Subcutaneous QHS  . sodium chloride flush  10-40 mL Intracatheter Q12H   Continuous Infusions: . sodium chloride 100 mL/hr at 05/30/19 0608  . sodium chloride 20 mL/hr at 05/31/19 0316  . heparin 900 Units/hr (05/31/19 0850)  . levETIRAcetam 500 mg (05/31/19 0514)  . piperacillin-tazobactam (ZOSYN)  IV 3.375 g (05/31/19 0842)  . potassium chloride 10 mEq (05/31/19 0840)     LOS: 2 days   Time Spent in minutes   30 minutes  Buck Mcaffee D.O. on 05/31/2019 at 10:49 AM  Between 7am to 7pm - Please see pager noted on [email or web address]  After 7pm go to [email or web address]  And look for the night coverage person covering for me after hours  [facility] Office  [phone]

## 2019-05-31 NOTE — Progress Notes (Signed)
Subjective: Patient complains of nausea and is concerned about intestinal obstruction as she has had projectile vomiting.   She is also requesting for a suppository as she feels constipated and has had very small amounts of formed stool.  Objective: Vital signs in last 24 hours: Temp:  [98.2 F (36.8 C)-98.7 F (37.1 C)] 98.7 F (37.1 C) (04/05 0906) Pulse Rate:  [59-77] 59 (04/05 0906) Resp:  [17-18] 18 (04/05 0906) BP: (105-159)/(47-68) 145/62 (04/05 0906) SpO2:  [94 %-99 %] 99 % (04/05 0906) Weight change:  Last BM Date: 05/29/19  PE: Obese, mild icterus, mild pallor GENERAL: Not in distress, able to speak in full sentences, bruising over right eye ABDOMEN: Soft, nondistended, normal active bowel sounds, nontender EXTREMITIES: No deformity  Lab Results: Results for orders placed or performed during the hospital encounter of 05/29/19 (from the past 48 hour(s))  CBC with Differential     Status: Abnormal   Collection Time: 05/29/19 12:38 PM  Result Value Ref Range   WBC 10.6 (H) 4.0 - 10.5 K/uL   RBC 3.85 (L) 3.87 - 5.11 MIL/uL   Hemoglobin 11.8 (L) 12.0 - 15.0 g/dL   HCT 36.6 36.0 - 46.0 %   MCV 95.1 80.0 - 100.0 fL   MCH 30.6 26.0 - 34.0 pg   MCHC 32.2 30.0 - 36.0 g/dL   RDW 16.9 (H) 11.5 - 15.5 %   Platelets 372 150 - 400 K/uL   nRBC 0.0 0.0 - 0.2 %   Neutrophils Relative % 91 %   Neutro Abs 9.7 (H) 1.7 - 7.7 K/uL   Lymphocytes Relative 4 %   Lymphs Abs 0.4 (L) 0.7 - 4.0 K/uL   Monocytes Relative 4 %   Monocytes Absolute 0.5 0.1 - 1.0 K/uL   Eosinophils Relative 0 %   Eosinophils Absolute 0.0 0.0 - 0.5 K/uL   Basophils Relative 0 %   Basophils Absolute 0.0 0.0 - 0.1 K/uL   Immature Granulocytes 1 %   Abs Immature Granulocytes 0.05 0.00 - 0.07 K/uL    Comment: Performed at [facility], [address], [place], [place] [address]  Comprehensive metabolic panel     Status: Abnormal   Collection Time: 05/29/19 12:38 PM  Result Value Ref Range   Sodium 138  135 - 145 mmol/L   Potassium 4.1 3.5 - 5.1 mmol/L   Chloride 92 (L) 98 - 111 mmol/L   CO2 29 22 - 32 mmol/L   Glucose, Bld 320 (H) 70 - 99 mg/dL    Comment: Glucose reference range applies only to samples taken after fasting for at least 8 hours.   BUN 16 8 - 23 mg/dL   Creatinine, Ser 0.65 0.44 - 1.00 mg/dL   Calcium 9.7 8.9 - 10.3 mg/dL   Total Protein 6.9 6.5 - 8.1 g/dL   Albumin 3.3 (L) 3.5 - 5.0 g/dL   AST 393 (H) 15 - 41 U/L   ALT 444 (H) 0 - 44 U/L   Alkaline Phosphatase 782 (H) 38 - 126 U/L   Total Bilirubin 4.1 (H) 0.3 - 1.2 mg/dL   GFR calc non Af Amer >60 >60 mL/min   GFR calc Af Amer >60 >60 mL/min   Anion gap 17 (H) 5 - 15    Comment: Performed at [place] [address], [place], [place] [address]  Lipase, blood     Status: None   Collection Time: 05/29/19 12:38 PM  Result Value Ref Range   Lipase  40 11 - 51 U/L    Comment: Performed at [facility], [place] [address] [place], [place] [address]  Ammonia     Status: Abnormal   Collection Time: 05/29/19 12:38 PM  Result Value Ref Range   Ammonia 36 (H) 9 - 35 umol/L    Comment: Performed at [facility], [place] [address] [place], [place] [address]  Protime-INR     Status: None   Collection Time: 05/29/19 12:38 PM  Result Value Ref Range   Prothrombin Time 13.2 11.4 - 15.2 seconds   INR 1.0 0.8 - 1.2    Comment: (NOTE) INR goal varies based on device and disease states. Performed at [facility], [place] [address] [place], [place] [address]   SARS CORONAVIRUS 2 (TAT 6-24 HRS) Nasopharyngeal Nasopharyngeal Swab     Status: None   Collection Time: 05/29/19  4:39 PM   Specimen: Nasopharyngeal Swab  Result Value Ref Range   SARS Coronavirus 2 NEGATIVE NEGATIVE    Comment: (NOTE) SARS-CoV-2 target nucleic acids are NOT DETECTED. The SARS-CoV-2 RNA is generally detectable in upper and lower respiratory specimens during the acute phase of infection. Negative results do not preclude  SARS-CoV-2 infection, do not rule out co-infections with other pathogens, and should not be used as the sole basis for treatment or other patient management decisions. Negative results must be combined with clinical observations, patient history, and epidemiological information. The expected result is Negative. Fact Sheet for Patients: SugarRoll.be Fact Sheet for Healthcare Providers: https://www.woods-mathews.com/ This test is not yet approved or cleared by the [place] FDA and  has been authorized for detection and/or diagnosis of SARS-CoV-2 by FDA under an Emergency Use Authorization (EUA). This EUA will remain  in effect (meaning this test can be used) for the duration of the COVID-19 declaration under Section 56 4(b)(1) of the Act, 21 U.S.C. section 360bbb-3(b)(1), unless the authorization is terminated or revoked sooner. Performed at [facility], [place] [address], [place], [place] [address]   Magnesium     Status: None   Collection Time: 05/29/19  4:48 PM  Result Value Ref Range   Magnesium 2.0 1.7 - 2.4 mg/dL    Comment: Performed at [facility], [place] [address], [place], [place] [address]  Culture, blood (routine x 2)     Status: None (Preliminary result)   Collection Time: 05/29/19  4:48 PM   Specimen: BLOOD  Result Value Ref Range   Specimen Description BLOOD RIGHT ARM    Special Requests      BOTTLES DRAWN AEROBIC AND ANAEROBIC Blood Culture adequate volume   Culture      NO GROWTH < 12 HOURS Performed at [facility], [place] [address], [place], [place] [address]    Report Status PENDING   Hepatitis panel, acute     Status: None   Collection Time: 05/29/19  4:48 PM  Result Value Ref Range   Hepatitis B Surface Ag NON REACTIVE NON REACTIVE   HCV Ab NON REACTIVE NON REACTIVE    Comment: (NOTE) Nonreactive HCV antibody screen is consistent with no HCV infections,  unless recent infection is suspected or  other evidence exists to indicate HCV infection.    Hep A IgM NON REACTIVE NON REACTIVE   Hep B C IgM NON REACTIVE NON REACTIVE    Comment: Performed at [facility], [place] [address], [place],  [address]  Hemoglobin A1c  Status: Abnormal   Collection Time: 05/29/19  4:48 PM  Result Value Ref Range   Hgb A1c MFr Bld 7.7 (H) 4.8 - 5.6 %    Comment: (NOTE) Pre diabetes:          5.7%-6.4% Diabetes:              >6.4% Glycemic control for   <7.0% adults with diabetes    Mean Plasma Glucose 174.29 mg/dL    Comment: Performed at [facility] [address] [place], [place] [address]  Glucose, capillary     Status: Abnormal   Collection Time: 05/29/19  5:14 PM  Result Value Ref Range   Glucose-Capillary 255 (H) 70 - 99 mg/dL    Comment: Glucose reference range applies only to samples taken after fasting for at least 8 hours.  Lactic acid, plasma     Status: None   Collection Time: 05/29/19  6:23 PM  Result Value Ref Range   Lactic Acid, Venous 1.1 0.5 - 1.9 mmol/L    Comment: Performed at [facility], [place] [address], [place], [place] [address]  Culture, blood (routine x 2)     Status: None (Preliminary result)   Collection Time: 05/29/19  6:23 PM   Specimen: BLOOD  Result Value Ref Range   Specimen Description BLOOD RIGHT ARM    Special Requests      BOTTLES DRAWN AEROBIC ONLY Blood Culture adequate volume   Culture      NO GROWTH < 12 HOURS Performed at [facility], [place] [address], [place], [place] [address]    Report Status PENDING   Glucose, capillary     Status: Abnormal   Collection Time: 05/29/19  8:10 PM  Result Value Ref Range   Glucose-Capillary 185 (H) 70 - 99 mg/dL    Comment: Glucose reference range applies only to samples taken after fasting for at least 8 hours.  Urinalysis, Routine w reflex microscopic     Status: Abnormal   Collection Time: 05/29/19 10:25 PM  Result Value Ref Range   Color, Urine AMBER (A) YELLOW     Comment: BIOCHEMICALS MAY BE AFFECTED BY COLOR   APPearance CLEAR CLEAR   Specific Gravity, Urine >1.046 (H) 1.005 - 1.030   pH 5.0 5.0 - 8.0   Glucose, UA >=500 (A) NEGATIVE mg/dL   Hgb urine dipstick NEGATIVE NEGATIVE   Bilirubin Urine NEGATIVE NEGATIVE   Ketones, ur 80 (A) NEGATIVE mg/dL   Protein, ur NEGATIVE NEGATIVE mg/dL   Nitrite NEGATIVE NEGATIVE   Leukocytes,Ua TRACE (A) NEGATIVE   RBC / HPF 0-5 0 - 5 RBC/hpf   WBC, UA 6-10 0 - 5 WBC/hpf   Bacteria, UA NONE SEEN NONE SEEN   Squamous Epithelial / LPF 0-5 0 - 5   Mucus PRESENT     Comment: Performed at [facility], [place] [address], [place], [place] [address]  Glucose, capillary     Status: Abnormal   Collection Time: 05/30/19 12:21 AM  Result Value Ref Range   Glucose-Capillary 182 (H) 70 - 99 mg/dL    Comment: Glucose reference range applies only to samples taken after fasting for at least 8 hours.  Glucose, capillary     Status: Abnormal   Collection Time: 05/30/19  2:57 AM  Result Value Ref Range   Glucose-Capillary 162 (H) 70 - 99 mg/dL    Comment: Glucose reference range applies only to samples taken after fasting for at least 8  hours.  Glucose, capillary     Status: Abnormal   Collection Time: 05/30/19  8:43 AM  Result Value Ref Range   Glucose-Capillary 122 (H) 70 - 99 mg/dL    Comment: Glucose reference range applies only to samples taken after fasting for at least 8 hours.  Comprehensive metabolic panel     Status: Abnormal   Collection Time: 05/30/19  9:39 AM  Result Value Ref Range   Sodium 143 135 - 145 mmol/L   Potassium 3.8 3.5 - 5.1 mmol/L   Chloride 103 98 - 111 mmol/L   CO2 30 22 - 32 mmol/L   Glucose, Bld 153 (H) 70 - 99 mg/dL    Comment: Glucose reference range applies only to samples taken after fasting for at least 8 hours.   BUN 13 8 - 23 mg/dL   Creatinine, Ser 0.47 0.44 - 1.00 mg/dL   Calcium 8.7 (L) 8.9 - 10.3 mg/dL   Total Protein 5.1 (L) 6.5 - 8.1 g/dL   Albumin 2.5 (L) 3.5 - 5.0  g/dL   AST 312 (H) 15 - 41 U/L   ALT 351 (H) 0 - 44 U/L   Alkaline Phosphatase 562 (H) 38 - 126 U/L   Total Bilirubin 2.7 (H) 0.3 - 1.2 mg/dL   GFR calc non Af Amer >60 >60 mL/min   GFR calc Af Amer >60 >60 mL/min   Anion gap 10 5 - 15    Comment: Performed at [facility], [place] [address] [place], [place] [address]  CBC     Status: Abnormal   Collection Time: 05/30/19  9:39 AM  Result Value Ref Range   WBC 9.6 4.0 - 10.5 K/uL   RBC 3.11 (L) 3.87 - 5.11 MIL/uL   Hemoglobin 9.7 (L) 12.0 - 15.0 g/dL   HCT 30.6 (L) 36.0 - 46.0 %   MCV 98.4 80.0 - 100.0 fL   MCH 31.2 26.0 - 34.0 pg   MCHC 31.7 30.0 - 36.0 g/dL   RDW 17.5 (H) 11.5 - 15.5 %   Platelets 291 150 - 400 K/uL   nRBC 0.0 0.0 - 0.2 %    Comment: Performed at [facility], [place] [address], [place], [place] [address]  Glucose, capillary     Status: Abnormal   Collection Time: 05/30/19 11:58 AM  Result Value Ref Range   Glucose-Capillary 100 (H) 70 - 99 mg/dL    Comment: Glucose reference range applies only to samples taken after fasting for at least 8 hours.  Glucose, capillary     Status: Abnormal   Collection Time: 05/30/19  5:30 PM  Result Value Ref Range   Glucose-Capillary 123 (H) 70 - 99 mg/dL    Comment: Glucose reference range applies only to samples taken after fasting for at least 8 hours.  Glucose, capillary     Status: Abnormal   Collection Time: 05/30/19  8:27 PM  Result Value Ref Range   Glucose-Capillary 135 (H) 70 - 99 mg/dL    Comment: Glucose reference range applies only to samples taken after fasting for at least 8 hours.  Heparin level (unfractionated)     Status: Abnormal   Collection Time: 05/30/19 11:00 PM  Result Value Ref Range   Heparin Unfractionated 1.10 (H) 0.30 - 0.70 IU/mL    Comment: (NOTE) If heparin results are below expected values, and patient dosage has  been confirmed, suggest follow up testing of antithrombin III levels. Performed at [facility], [address] [place], [place] [address]   Glucose, capillary     Status: Abnormal   Collection Time: 05/30/19 11:57 PM  Result Value Ref Range   Glucose-Capillary 162 (H) 70 - 99 mg/dL    Comment: Glucose reference range applies only to samples taken after fasting for at least 8 hours.  CBC     Status: Abnormal   Collection Time: 05/31/19  4:11 AM  Result Value Ref Range   WBC 6.8 4.0 - 10.5 K/uL   RBC 2.89 (L) 3.87 - 5.11 MIL/uL   Hemoglobin 8.9 (L) 12.0 - 15.0 g/dL   HCT 28.5 (L) 36.0 - 46.0 %   MCV 98.6 80.0 - 100.0 fL   MCH 30.8 26.0 - 34.0 pg   MCHC 31.2 30.0 - 36.0 g/dL   RDW 16.9 (H) 11.5 - 15.5 %   Platelets 272 150 - 400 K/uL   nRBC 0.0 0.0 - 0.2 %    Comment: Performed at [place] [address], [place], [place] [address]  Comprehensive metabolic panel     Status: Abnormal   Collection Time: 05/31/19  4:11 AM  Result Value Ref Range   Sodium 140 135 - 145 mmol/L   Potassium 2.4 (LL) 3.5 - 5.1 mmol/L    Comment: CRITICAL RESULT CALLED TO, READ BACK BY AND VERIFIED WITH: Luisa Dago RN [phone] M GARRETT    Chloride 104 98 - 111 mmol/L   CO2 27 22 - 32 mmol/L   Glucose, Bld 116 (H) 70 - 99 mg/dL    Comment: Glucose reference range applies only to samples taken after fasting for at least 8 hours.   BUN 9 8 - 23 mg/dL   Creatinine, Ser 0.41 (L) 0.44 - 1.00 mg/dL   Calcium 8.1 (L) 8.9 - 10.3 mg/dL   Total Protein 4.6 (L) 6.5 - 8.1 g/dL   Albumin 2.2 (L) 3.5 - 5.0 g/dL   AST 228 (H) 15 - 41 U/L   ALT 289 (H) 0 - 44 U/L   Alkaline Phosphatase 443 (H) 38 - 126 U/L   Total Bilirubin 2.0 (H) 0.3 - 1.2 mg/dL   GFR calc non Af Amer >60 >60 mL/min   GFR calc Af Amer >60 >60 mL/min   Anion gap 9 5 - 15    Comment: Performed at [facility], [place] [address], [place], [place] [address]  Glucose, capillary     Status: Abnormal   Collection Time: 05/31/19  5:16 AM  Result Value Ref Range   Glucose-Capillary 103 (H) 70 - 99 mg/dL    Comment: Glucose reference  range applies only to samples taken after fasting for at least 8 hours.  Magnesium     Status: None   Collection Time: 05/31/19  6:56 AM  Result Value Ref Range   Magnesium 1.8 1.7 - 2.4 mg/dL    Comment: Performed at [facility] [address], [place], [place] [address]  Glucose, capillary     Status: None   Collection Time: 05/31/19  8:48 AM  Result Value Ref Range   Glucose-Capillary 90 70 - 99 mg/dL    Comment: Glucose reference range applies only to samples taken after fasting for at least 8 hours.    Studies/Results: CT Head Wo Contrast  Result Date: 05/29/2019 CLINICAL DATA:  Pain following fall EXAM: CT HEAD WITHOUT CONTRAST TECHNIQUE: Contiguous axial images were obtained from the base of the skull through the vertex without intravenous  contrast. COMPARISON:  February 25, 2017 FINDINGS: Brain: Ventricles and sulci are normal in size and configuration. There is no intracranial mass, hemorrhage, extra-axial fluid collection, or midline shift. There is mild small vessel disease in the centra semiovale, stable. No acute infarct evident. Vascular: No hyperdense vessel. There is calcification in each carotid siphon region. Skull: Bony calvarium a appears intact. Sinuses/Orbits: There is mucosal thickening in several ethmoid air cells. Other visualized paranasal sinuses are clear. Orbits appear symmetric bilaterally. Other: Mastoid air cells are clear. IMPRESSION: Mild periventricular small vessel disease. No acute infarct. No mass or hemorrhage. There are foci of arterial vascular calcification. Mucosal thickening noted in several ethmoid air cells. Electronically Signed   By: Lowella Grip III M.D.   On: 05/29/2019 15:07   CT Abdomen Pelvis W Contrast  Result Date: 05/29/2019 CLINICAL DATA:  Nausea vomiting. Reported history of a bowel obstruction. Mechanical fall last night. EXAM: CT ABDOMEN AND PELVIS WITH CONTRAST TECHNIQUE: Multidetector CT imaging of the abdomen and pelvis was  performed using the standard protocol following bolus administration of intravenous contrast. CONTRAST:  162mL OMNIPAQUE IOHEXOL 300 MG/ML  SOLN COMPARISON:  CT, 05/07/2019 FINDINGS: Lower chest: No acute abnormality. Hepatobiliary: Liver normal in size. No mass or focal lesion. There is significant intrahepatic bile duct dilation. Common hepatic duct measures 13 mm in diameter. Common bile duct appears normal in caliber. Duct dilation was present on the prior CT, but appears increased. Status post cholecystectomy. Pancreas: Atrophy with irregular dilation of the pancreatic duct, from the body through the tail, but no defined mass and no inflammation. Spleen: Normal in size without focal abnormality. Adrenals/Urinary Tract: Adrenal glands are unremarkable. Kidneys are normal, without renal calculi, focal lesion, or hydronephrosis. Bladder is unremarkable. Stomach/Bowel: Normal stomach. Small bowel and colon are normal in caliber. No wall thickening. No inflammation. There are diverticula most evident along the sigmoid colon. No diverticulitis. No evidence of appendicitis Vascular/Lymphatic: Mild aortic atherosclerosis. No aneurysm. No enlarged lymph nodes. Reproductive: Status post hysterectomy. No adnexal masses. Other: Midline anterior abdominal wall incision. No hernia. Trace fluid adjacent of the liver. Musculoskeletal: No fracture or acute finding. No osteoblastic or osteolytic lesions. IMPRESSION: 1. Intrahepatic bile duct dilation extending to the common hepatic duct, increased in severity compared to the prior CT. Etiology of this is unclear. No pancreatic mass. No visualized duct stone. Given the low sensitivity of CT for choledocholithiasis, recommend follow-up MRCP or ERCP. Trace ascites adjacent to the liver. 2. No other evidence of an acute or recent abnormality. No evidence of bowel obstruction or inflammation. Electronically Signed   By: Lajean Manes M.D.   On: 05/29/2019 15:12   MR ABDOMEN MRCP W  WO CONTAST  Result Date: 05/30/2019 CLINICAL DATA:  Jaundice, weight loss, biliary ductal dilatation by CT, history of bowel obstruction EXAM: MRI ABDOMEN WITHOUT AND WITH CONTRAST (INCLUDING MRCP) TECHNIQUE: Multiplanar multisequence MR imaging of the abdomen was performed both before and after the administration of intravenous contrast. Heavily T2-weighted images of the biliary and pancreatic ducts were obtained, and three-dimensional MRCP images were rendered by post processing. CONTRAST:  61mL GADAVIST GADOBUTROL 1 MMOL/ML IV SOLN COMPARISON:  CT abdomen pelvis, 05/29/2019, 05/07/2019 FINDINGS: Lower chest: No acute findings. Hepatobiliary: No mass or other parenchymal abnormality identified. There is extensive intrahepatic and extrahepatic biliary ductal dilatation, as seen on prior CT dated 05/29/2019 and significantly increased comparison to CT dated 05/07/2019, measuring up to 1.2 cm in caliber. MRCP of the central portion of the common bile  duct is limited by metallic susceptibility artifact from cholecystectomy clips. Pancreas: There is severe dilatation of the pancreatic duct in the pancreatic neck and more distally, measuring up to 1.0 cm in caliber. There is no definitely visualized pancreatic mass, however there appears to be a very subtle, ill-defined lesion of the pancreatic head with subtle soft tissue stranding about the adjacent duodenum, portal vein, superior mesenteric vein, and superior mesenteric artery (e.g. Series 4, image 17, series 34, image 47). This measures no greater than 2.5 cm. Spleen:  Within normal limits in size and appearance. Adrenals/Urinary Tract: No masses identified. No evidence of hydronephrosis. Stomach/Bowel: Visualized portions within the abdomen are unremarkable. Vascular/Lymphatic: No pathologically enlarged lymph nodes identified. No abdominal aortic aneurysm demonstrated. Other: Trace perihepatic ascites. Small omental and peritoneal nodules (series 32, image 58,  49). Musculoskeletal: No suspicious bone lesions identified. IMPRESSION: 1. 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 There appears to be a very subtle, ill-defined lesion in the pancreatic head with soft tissue stranding about the adjacent duodenum, portal vein, superior mesenteric vein, and superior mesenteric artery measuring no greater than 2.5 cm. 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 Consider ERCP for tissue sampling. 2. Trace perihepatic ascites with small omental and peritoneal nodules, highly suspicious for peritoneal metastatic disease. Electronically Signed   By: Eddie Candle M.D.   On: 05/30/2019 10:19   VAS [place] LOWER EXTREMITY VENOUS (DVT)  Result Date: 05/30/2019  Lower Venous DVT Study Indications: Swelling.  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 Limitations: Patient's pain and holding of breath with compression,. Performing Technologist: Sharion Dove RVS  Examination Guidelines: A complete evaluation includes B-mode imaging, spectral Doppler, color Doppler, and power Doppler as needed of all accessible portions of each vessel. Bilateral testing is considered an integral part of a complete examination. Limited examinations for reoccurring indications may be performed as noted. The reflux portion of the exam is performed with the patient in reverse Trendelenburg.  +---------+---------------+---------+-----------+----------+--------------+ RIGHT    CompressibilityPhasicitySpontaneityPropertiesThrombus Aging +---------+---------------+---------+-----------+----------+--------------+ CFV      Full           Yes      Yes                                  +---------+---------------+---------+-----------+----------+--------------+ SFJ      Full                                                        +---------+---------------+---------+-----------+----------+--------------+ FV Prox  Full                                                        +---------+---------------+---------+-----------+----------+--------------+ FV Mid   Full                                                        +---------+---------------+---------+-----------+----------+--------------+  FV DistalFull                                                        +---------+---------------+---------+-----------+----------+--------------+ PFV      Full                                                        +---------+---------------+---------+-----------+----------+--------------+ POP      Full           Yes      Yes                                 +---------+---------------+---------+-----------+----------+--------------+ PTV      Full                                                        +---------+---------------+---------+-----------+----------+--------------+ PERO     Full                                                        +---------+---------------+---------+-----------+----------+--------------+   +---------+---------------+---------+-----------+----------+-----------------+ LEFT     CompressibilityPhasicitySpontaneityPropertiesThrombus Aging    +---------+---------------+---------+-----------+----------+-----------------+ CFV      Full           Yes      Yes                                    +---------+---------------+---------+-----------+----------+-----------------+ SFJ      Full                                                           +---------+---------------+---------+-----------+----------+-----------------+ FV Prox  Full                                                            +---------+---------------+---------+-----------+----------+-----------------+ FV Mid   Full                                                           +---------+---------------+---------+-----------+----------+-----------------+ FV DistalFull                                                           +---------+---------------+---------+-----------+----------+-----------------+  PFV      Full                                                           +---------+---------------+---------+-----------+----------+-----------------+ POP      Full           Yes      Yes                                    +---------+---------------+---------+-----------+----------+-----------------+ PTV      Partial                                      Age Indeterminate +---------+---------------+---------+-----------+----------+-----------------+ PERO     Partial                                      Age Indeterminate +---------+---------------+---------+-----------+----------+-----------------+     Summary: RIGHT: - There is no evidence of deep vein thrombosis in the lower extremity.  LEFT: - Findings consistent with age indeterminate deep vein thrombosis involving the left posterior tibial veins, and left peroneal veins.  *See table(s) above for measurements and observations.    Preliminary     Medications: I have reviewed the patient's current medications.  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 I had a detailed discussion about the procedure, risks and benefits of the procedure with the patient and her first cousin at bedside.  Clear liquid diet today, n.p.o. post midnight. Abdominal x-ray today as patient feels nauseous and is worried about projectile vomiting when her head is lowered down/supine position. Hold heparin drip at 6 AM.   Ronnette Juniper, MD 05/31/2019, 11:07 AM

## 2019-05-31 NOTE — Plan of Care (Signed)
  Problem: Education: Goal: Knowledge of General Education information will improve Description Including pain rating scale, medication(s)/side effects and non-pharmacologic comfort measures Outcome: Progressing   

## 2019-05-31 NOTE — Progress Notes (Signed)
Heparin level not needed at this time per RN

## 2019-05-31 NOTE — H&P (View-Only) (Signed)
Subjective: Patient complains of nausea and is concerned about intestinal obstruction as she has had projectile vomiting.   She is also requesting for a suppository as she feels constipated and has had very small amounts of formed stool.  Objective: Vital signs in last 24 hours: Temp:  [98.2 F (36.8 C)-98.7 F (37.1 C)] 98.7 F (37.1 C) (04/05 0906) Pulse Rate:  [59-77] 59 (04/05 0906) Resp:  [17-18] 18 (04/05 0906) BP: (105-159)/(47-68) 145/62 (04/05 0906) SpO2:  [94 %-99 %] 99 % (04/05 0906) Weight change:  Last BM Date: 05/29/19  PE: Obese, mild icterus, mild pallor GENERAL: Not in distress, able to speak in full sentences, bruising over right eye ABDOMEN: Soft, nondistended, normal active bowel sounds, nontender EXTREMITIES: No deformity  Lab Results: Results for orders placed or performed during the hospital encounter of 05/29/19 (from the past 48 hour(s))  CBC with Differential     Status: Abnormal   Collection Time: 05/29/19 12:38 PM  Result Value Ref Range   WBC 10.6 (H) 4.0 - 10.5 K/uL   RBC 3.85 (L) 3.87 - 5.11 MIL/uL   Hemoglobin 11.8 (L) 12.0 - 15.0 g/dL   HCT 36.6 36.0 - 46.0 %   MCV 95.1 80.0 - 100.0 fL   MCH 30.6 26.0 - 34.0 pg   MCHC 32.2 30.0 - 36.0 g/dL   RDW 16.9 (H) 11.5 - 15.5 %   Platelets 372 150 - 400 K/uL   nRBC 0.0 0.0 - 0.2 %   Neutrophils Relative % 91 %   Neutro Abs 9.7 (H) 1.7 - 7.7 K/uL   Lymphocytes Relative 4 %   Lymphs Abs 0.4 (L) 0.7 - 4.0 K/uL   Monocytes Relative 4 %   Monocytes Absolute 0.5 0.1 - 1.0 K/uL   Eosinophils Relative 0 %   Eosinophils Absolute 0.0 0.0 - 0.5 K/uL   Basophils Relative 0 %   Basophils Absolute 0.0 0.0 - 0.1 K/uL   Immature Granulocytes 1 %   Abs Immature Granulocytes 0.05 0.00 - 0.07 K/uL    Comment: Performed at [facility], [address] [place], [place] [address]  Comprehensive metabolic panel     Status: Abnormal   Collection Time: 05/29/19 12:38 PM  Result Value Ref Range   Sodium 138  135 - 145 mmol/L   Potassium 4.1 3.5 - 5.1 mmol/L   Chloride 92 (L) 98 - 111 mmol/L   CO2 29 22 - 32 mmol/L   Glucose, Bld 320 (H) 70 - 99 mg/dL    Comment: Glucose reference range applies only to samples taken after fasting for at least 8 hours.   BUN 16 8 - 23 mg/dL   Creatinine, Ser 0.65 0.44 - 1.00 mg/dL   Calcium 9.7 8.9 - 10.3 mg/dL   Total Protein 6.9 6.5 - 8.1 g/dL   Albumin 3.3 (L) 3.5 - 5.0 g/dL   AST 393 (H) 15 - 41 U/L   ALT 444 (H) 0 - 44 U/L   Alkaline Phosphatase 782 (H) 38 - 126 U/L   Total Bilirubin 4.1 (H) 0.3 - 1.2 mg/dL   GFR calc non Af Amer >60 >60 mL/min   GFR calc Af Amer >60 >60 mL/min   Anion gap 17 (H) 5 - 15    Comment: Performed at [facility] [address] [place], [place] [address]  Lipase, blood     Status: None   Collection Time: 05/29/19 12:38 PM  Result Value Ref Range   Lipase  40 11 - 51 U/L    Comment: Performed at [facility], [place] [address], [place], [place] [address]  Ammonia     Status: Abnormal   Collection Time: 05/29/19 12:38 PM  Result Value Ref Range   Ammonia 36 (H) 9 - 35 umol/L    Comment: Performed at [facility], [place] [address], [place], [place] [address]  Protime-INR     Status: None   Collection Time: 05/29/19 12:38 PM  Result Value Ref Range   Prothrombin Time 13.2 11.4 - 15.2 seconds   INR 1.0 0.8 - 1.2    Comment: (NOTE) INR goal varies based on device and disease states. Performed at [facility], [place] [address], [place], [place] [address]   SARS CORONAVIRUS 2 (TAT 6-24 HRS) Nasopharyngeal Nasopharyngeal Swab     Status: None   Collection Time: 05/29/19  4:39 PM   Specimen: Nasopharyngeal Swab  Result Value Ref Range   SARS Coronavirus 2 NEGATIVE NEGATIVE    Comment: (NOTE) SARS-CoV-2 target nucleic acids are NOT DETECTED. The SARS-CoV-2 RNA is generally detectable in upper and lower respiratory specimens during the acute phase of infection. Negative results do not preclude  SARS-CoV-2 infection, do not rule out co-infections with other pathogens, and should not be used as the sole basis for treatment or other patient management decisions. Negative results must be combined with clinical observations, patient history, and epidemiological information. The expected result is Negative. Fact Sheet for Patients: SugarRoll.be Fact Sheet for Healthcare Providers: https://www.woods-mathews.com/ This test is not yet approved or cleared by the [place] FDA and  has been authorized for detection and/or diagnosis of SARS-CoV-2 by FDA under an Emergency Use Authorization (EUA). This EUA will remain  in effect (meaning this test can be used) for the duration of the COVID-19 declaration under Section 56 4(b)(1) of the Act, 21 U.S.C. section 360bbb-3(b)(1), unless the authorization is terminated or revoked sooner. Performed at [facility], [place] [address], [place], [place] [address]   Magnesium     Status: None   Collection Time: 05/29/19  4:48 PM  Result Value Ref Range   Magnesium 2.0 1.7 - 2.4 mg/dL    Comment: Performed at [facility], [place] [address], [place], [place] [address]  Culture, blood (routine x 2)     Status: None (Preliminary result)   Collection Time: 05/29/19  4:48 PM   Specimen: BLOOD  Result Value Ref Range   Specimen Description BLOOD RIGHT ARM    Special Requests      BOTTLES DRAWN AEROBIC AND ANAEROBIC Blood Culture adequate volume   Culture      NO GROWTH < 12 HOURS Performed at [facility], [place] [address], [place], [place] [address]    Report Status PENDING   Hepatitis panel, acute     Status: None   Collection Time: 05/29/19  4:48 PM  Result Value Ref Range   Hepatitis B Surface Ag NON REACTIVE NON REACTIVE   HCV Ab NON REACTIVE NON REACTIVE    Comment: (NOTE) Nonreactive HCV antibody screen is consistent with no HCV infections,  unless recent infection is suspected or  other evidence exists to indicate HCV infection.    Hep A IgM NON REACTIVE NON REACTIVE   Hep B C IgM NON REACTIVE NON REACTIVE    Comment: Performed at [facility], [place] [address] [place],  [address]  Hemoglobin A1c  Status: Abnormal   Collection Time: 05/29/19  4:48 PM  Result Value Ref Range   Hgb A1c MFr Bld 7.7 (H) 4.8 - 5.6 %    Comment: (NOTE) Pre diabetes:          5.7%-6.4% Diabetes:              >6.4% Glycemic control for   <7.0% adults with diabetes    Mean Plasma Glucose 174.29 mg/dL    Comment: Performed at [facility] [address] [place], [place] [address]  Glucose, capillary     Status: Abnormal   Collection Time: 05/29/19  5:14 PM  Result Value Ref Range   Glucose-Capillary 255 (H) 70 - 99 mg/dL    Comment: Glucose reference range applies only to samples taken after fasting for at least 8 hours.  Lactic acid, plasma     Status: None   Collection Time: 05/29/19  6:23 PM  Result Value Ref Range   Lactic Acid, Venous 1.1 0.5 - 1.9 mmol/L    Comment: Performed at [facility], [place] [address], [place], [place] [address]  Culture, blood (routine x 2)     Status: None (Preliminary result)   Collection Time: 05/29/19  6:23 PM   Specimen: BLOOD  Result Value Ref Range   Specimen Description BLOOD RIGHT ARM    Special Requests      BOTTLES DRAWN AEROBIC ONLY Blood Culture adequate volume   Culture      NO GROWTH < 12 HOURS Performed at [facility], [place] [address] [place], [place] [address]    Report Status PENDING   Glucose, capillary     Status: Abnormal   Collection Time: 05/29/19  8:10 PM  Result Value Ref Range   Glucose-Capillary 185 (H) 70 - 99 mg/dL    Comment: Glucose reference range applies only to samples taken after fasting for at least 8 hours.  Urinalysis, Routine w reflex microscopic     Status: Abnormal   Collection Time: 05/29/19 10:25 PM  Result Value Ref Range   Color, Urine AMBER (A) YELLOW     Comment: BIOCHEMICALS MAY BE AFFECTED BY COLOR   APPearance CLEAR CLEAR   Specific Gravity, Urine >1.046 (H) 1.005 - 1.030   pH 5.0 5.0 - 8.0   Glucose, UA >=500 (A) NEGATIVE mg/dL   Hgb urine dipstick NEGATIVE NEGATIVE   Bilirubin Urine NEGATIVE NEGATIVE   Ketones, ur 80 (A) NEGATIVE mg/dL   Protein, ur NEGATIVE NEGATIVE mg/dL   Nitrite NEGATIVE NEGATIVE   Leukocytes,Ua TRACE (A) NEGATIVE   RBC / HPF 0-5 0 - 5 RBC/hpf   WBC, UA 6-10 0 - 5 WBC/hpf   Bacteria, UA NONE SEEN NONE SEEN   Squamous Epithelial / LPF 0-5 0 - 5   Mucus PRESENT     Comment: Performed at [facility], [place] [address], [place], [place] [address]  Glucose, capillary     Status: Abnormal   Collection Time: 05/30/19 12:21 AM  Result Value Ref Range   Glucose-Capillary 182 (H) 70 - 99 mg/dL    Comment: Glucose reference range applies only to samples taken after fasting for at least 8 hours.  Glucose, capillary     Status: Abnormal   Collection Time: 05/30/19  2:57 AM  Result Value Ref Range   Glucose-Capillary 162 (H) 70 - 99 mg/dL    Comment: Glucose reference range applies only to samples taken after fasting for at least 8  hours.  Glucose, capillary     Status: Abnormal   Collection Time: 05/30/19  8:43 AM  Result Value Ref Range   Glucose-Capillary 122 (H) 70 - 99 mg/dL    Comment: Glucose reference range applies only to samples taken after fasting for at least 8 hours.  Comprehensive metabolic panel     Status: Abnormal   Collection Time: 05/30/19  9:39 AM  Result Value Ref Range   Sodium 143 135 - 145 mmol/L   Potassium 3.8 3.5 - 5.1 mmol/L   Chloride 103 98 - 111 mmol/L   CO2 30 22 - 32 mmol/L   Glucose, Bld 153 (H) 70 - 99 mg/dL    Comment: Glucose reference range applies only to samples taken after fasting for at least 8 hours.   BUN 13 8 - 23 mg/dL   Creatinine, Ser 0.47 0.44 - 1.00 mg/dL   Calcium 8.7 (L) 8.9 - 10.3 mg/dL   Total Protein 5.1 (L) 6.5 - 8.1 g/dL   Albumin 2.5 (L) 3.5 - 5.0  g/dL   AST 312 (H) 15 - 41 U/L   ALT 351 (H) 0 - 44 U/L   Alkaline Phosphatase 562 (H) 38 - 126 U/L   Total Bilirubin 2.7 (H) 0.3 - 1.2 mg/dL   GFR calc non Af Amer >60 >60 mL/min   GFR calc Af Amer >60 >60 mL/min   Anion gap 10 5 - 15    Comment: Performed at [facility], [place] [address], [place], [place] [address]  CBC     Status: Abnormal   Collection Time: 05/30/19  9:39 AM  Result Value Ref Range   WBC 9.6 4.0 - 10.5 K/uL   RBC 3.11 (L) 3.87 - 5.11 MIL/uL   Hemoglobin 9.7 (L) 12.0 - 15.0 g/dL   HCT 30.6 (L) 36.0 - 46.0 %   MCV 98.4 80.0 - 100.0 fL   MCH 31.2 26.0 - 34.0 pg   MCHC 31.7 30.0 - 36.0 g/dL   RDW 17.5 (H) 11.5 - 15.5 %   Platelets 291 150 - 400 K/uL   nRBC 0.0 0.0 - 0.2 %    Comment: Performed at [facility], [place] [address] [place], [place] [address]  Glucose, capillary     Status: Abnormal   Collection Time: 05/30/19 11:58 AM  Result Value Ref Range   Glucose-Capillary 100 (H) 70 - 99 mg/dL    Comment: Glucose reference range applies only to samples taken after fasting for at least 8 hours.  Glucose, capillary     Status: Abnormal   Collection Time: 05/30/19  5:30 PM  Result Value Ref Range   Glucose-Capillary 123 (H) 70 - 99 mg/dL    Comment: Glucose reference range applies only to samples taken after fasting for at least 8 hours.  Glucose, capillary     Status: Abnormal   Collection Time: 05/30/19  8:27 PM  Result Value Ref Range   Glucose-Capillary 135 (H) 70 - 99 mg/dL    Comment: Glucose reference range applies only to samples taken after fasting for at least 8 hours.  Heparin level (unfractionated)     Status: Abnormal   Collection Time: 05/30/19 11:00 PM  Result Value Ref Range   Heparin Unfractionated 1.10 (H) 0.30 - 0.70 IU/mL    Comment: (NOTE) If heparin results are below expected values, and patient dosage has  been confirmed, suggest follow up testing of antithrombin III levels. Performed at [facility], [address] [place], [place] [address]   Glucose, capillary     Status: Abnormal   Collection Time: 05/30/19 11:57 PM  Result Value Ref Range   Glucose-Capillary 162 (H) 70 - 99 mg/dL    Comment: Glucose reference range applies only to samples taken after fasting for at least 8 hours.  CBC     Status: Abnormal   Collection Time: 05/31/19  4:11 AM  Result Value Ref Range   WBC 6.8 4.0 - 10.5 K/uL   RBC 2.89 (L) 3.87 - 5.11 MIL/uL   Hemoglobin 8.9 (L) 12.0 - 15.0 g/dL   HCT 28.5 (L) 36.0 - 46.0 %   MCV 98.6 80.0 - 100.0 fL   MCH 30.8 26.0 - 34.0 pg   MCHC 31.2 30.0 - 36.0 g/dL   RDW 16.9 (H) 11.5 - 15.5 %   Platelets 272 150 - 400 K/uL   nRBC 0.0 0.0 - 0.2 %    Comment: Performed at [facility] [address] [place], [place] [address]  Comprehensive metabolic panel     Status: Abnormal   Collection Time: 05/31/19  4:11 AM  Result Value Ref Range   Sodium 140 135 - 145 mmol/L   Potassium 2.4 (LL) 3.5 - 5.1 mmol/L    Comment: CRITICAL RESULT CALLED TO, READ BACK BY AND VERIFIED WITH: Luisa Dago RN [id number] [phone] M GARRETT    Chloride 104 98 - 111 mmol/L   CO2 27 22 - 32 mmol/L   Glucose, Bld 116 (H) 70 - 99 mg/dL    Comment: Glucose reference range applies only to samples taken after fasting for at least 8 hours.   BUN 9 8 - 23 mg/dL   Creatinine, Ser 0.41 (L) 0.44 - 1.00 mg/dL   Calcium 8.1 (L) 8.9 - 10.3 mg/dL   Total Protein 4.6 (L) 6.5 - 8.1 g/dL   Albumin 2.2 (L) 3.5 - 5.0 g/dL   AST 228 (H) 15 - 41 U/L   ALT 289 (H) 0 - 44 U/L   Alkaline Phosphatase 443 (H) 38 - 126 U/L   Total Bilirubin 2.0 (H) 0.3 - 1.2 mg/dL   GFR calc non Af Amer >60 >60 mL/min   GFR calc Af Amer >60 >60 mL/min   Anion gap 9 5 - 15    Comment: Performed at [facility], [place] [address] [place], [place] [address]  Glucose, capillary     Status: Abnormal   Collection Time: 05/31/19  5:16 AM  Result Value Ref Range   Glucose-Capillary 103 (H) 70 - 99 mg/dL    Comment: Glucose reference  range applies only to samples taken after fasting for at least 8 hours.  Magnesium     Status: None   Collection Time: 05/31/19  6:56 AM  Result Value Ref Range   Magnesium 1.8 1.7 - 2.4 mg/dL    Comment: Performed at [facility] [address] [place], [place] [address]  Glucose, capillary     Status: None   Collection Time: 05/31/19  8:48 AM  Result Value Ref Range   Glucose-Capillary 90 70 - 99 mg/dL    Comment: Glucose reference range applies only to samples taken after fasting for at least 8 hours.    Studies/Results: CT Head Wo Contrast  Result Date: 05/29/2019 CLINICAL DATA:  Pain following fall EXAM: CT HEAD WITHOUT CONTRAST TECHNIQUE: Contiguous axial images were obtained from the base of the skull through the vertex without intravenous  contrast. COMPARISON:  February 25, 2017 FINDINGS: Brain: Ventricles and sulci are normal in size and configuration. There is no intracranial mass, hemorrhage, extra-axial fluid collection, or midline shift. There is mild small vessel disease in the centra semiovale, stable. No acute infarct evident. Vascular: No hyperdense vessel. There is calcification in each carotid siphon region. Skull: Bony calvarium a appears intact. Sinuses/Orbits: There is mucosal thickening in several ethmoid air cells. Other visualized paranasal sinuses are clear. Orbits appear symmetric bilaterally. Other: Mastoid air cells are clear. IMPRESSION: Mild periventricular small vessel disease. No acute infarct. No mass or hemorrhage. There are foci of arterial vascular calcification. Mucosal thickening noted in several ethmoid air cells. Electronically Signed   By: Lowella Grip III M.D.   On: 05/29/2019 15:07   CT Abdomen Pelvis W Contrast  Result Date: 05/29/2019 CLINICAL DATA:  Nausea vomiting. Reported history of a bowel obstruction. Mechanical fall last night. EXAM: CT ABDOMEN AND PELVIS WITH CONTRAST TECHNIQUE: Multidetector CT imaging of the abdomen and pelvis was  performed using the standard protocol following bolus administration of intravenous contrast. CONTRAST:  163mL OMNIPAQUE IOHEXOL 300 MG/ML  SOLN COMPARISON:  CT, 05/07/2019 FINDINGS: Lower chest: No acute abnormality. Hepatobiliary: Liver normal in size. No mass or focal lesion. There is significant intrahepatic bile duct dilation. Common hepatic duct measures 13 mm in diameter. Common bile duct appears normal in caliber. Duct dilation was present on the prior CT, but appears increased. Status post cholecystectomy. Pancreas: Atrophy with irregular dilation of the pancreatic duct, from the body through the tail, but no defined mass and no inflammation. Spleen: Normal in size without focal abnormality. Adrenals/Urinary Tract: Adrenal glands are unremarkable. Kidneys are normal, without renal calculi, focal lesion, or hydronephrosis. Bladder is unremarkable. Stomach/Bowel: Normal stomach. Small bowel and colon are normal in caliber. No wall thickening. No inflammation. There are diverticula most evident along the sigmoid colon. No diverticulitis. No evidence of appendicitis Vascular/Lymphatic: Mild aortic atherosclerosis. No aneurysm. No enlarged lymph nodes. Reproductive: Status post hysterectomy. No adnexal masses. Other: Midline anterior abdominal wall incision. No hernia. Trace fluid adjacent of the liver. Musculoskeletal: No fracture or acute finding. No osteoblastic or osteolytic lesions. IMPRESSION: 1. Intrahepatic bile duct dilation extending to the common hepatic duct, increased in severity compared to the prior CT. Etiology of this is unclear. No pancreatic mass. No visualized duct stone. Given the low sensitivity of CT for choledocholithiasis, recommend follow-up MRCP or ERCP. Trace ascites adjacent to the liver. 2. No other evidence of an acute or recent abnormality. No evidence of bowel obstruction or inflammation. Electronically Signed   By: Lajean Manes M.D.   On: 05/29/2019 15:12   MR ABDOMEN MRCP W  WO CONTAST  Result Date: 05/30/2019 CLINICAL DATA:  Jaundice, weight loss, biliary ductal dilatation by CT, history of bowel obstruction EXAM: MRI ABDOMEN WITHOUT AND WITH CONTRAST (INCLUDING MRCP) TECHNIQUE: Multiplanar multisequence MR imaging of the abdomen was performed both before and after the administration of intravenous contrast. Heavily T2-weighted images of the biliary and pancreatic ducts were obtained, and three-dimensional MRCP images were rendered by post processing. CONTRAST:  50mL GADAVIST GADOBUTROL 1 MMOL/ML IV SOLN COMPARISON:  CT abdomen pelvis, 05/29/2019, 05/07/2019 FINDINGS: Lower chest: No acute findings. Hepatobiliary: No mass or other parenchymal abnormality identified. There is extensive intrahepatic and extrahepatic biliary ductal dilatation, as seen on prior CT dated 05/29/2019 and significantly increased comparison to CT dated 05/07/2019, measuring up to 1.2 cm in caliber. MRCP of the central portion of the common bile  duct is limited by metallic susceptibility artifact from cholecystectomy clips. Pancreas: There is severe dilatation of the pancreatic duct in the pancreatic neck and more distally, measuring up to 1.0 cm in caliber. There is no definitely visualized pancreatic mass, however there appears to be a very subtle, ill-defined lesion of the pancreatic head with subtle soft tissue stranding about the adjacent duodenum, portal vein, superior mesenteric vein, and superior mesenteric artery (e.g. Series 4, image 17, series 34, image 47). This measures no greater than 2.5 cm. Spleen:  Within normal limits in size and appearance. Adrenals/Urinary Tract: No masses identified. No evidence of hydronephrosis. Stomach/Bowel: Visualized portions within the abdomen are unremarkable. Vascular/Lymphatic: No pathologically enlarged lymph nodes identified. No abdominal aortic aneurysm demonstrated. Other: Trace perihepatic ascites. Small omental and peritoneal nodules (series 32, image 58,  49). Musculoskeletal: No suspicious bone lesions identified. IMPRESSION: 1. 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 There appears to be a very subtle, ill-defined lesion in the pancreatic head with soft tissue stranding about the adjacent duodenum, portal vein, superior mesenteric vein, and superior mesenteric artery measuring no greater than 2.5 cm. 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 Consider ERCP for tissue sampling. 2. Trace perihepatic ascites with small omental and peritoneal nodules, highly suspicious for peritoneal metastatic disease. Electronically Signed   By: Eddie Candle M.D.   On: 05/30/2019 10:19   VAS [place] LOWER EXTREMITY VENOUS (DVT)  Result Date: 05/30/2019  Lower Venous DVT Study Indications: Swelling.  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 Limitations: Patient's pain and holding of breath with compression,. Performing Technologist: Sharion Dove RVS  Examination Guidelines: A complete evaluation includes B-mode imaging, spectral Doppler, color Doppler, and power Doppler as needed of all accessible portions of each vessel. Bilateral testing is considered an integral part of a complete examination. Limited examinations for reoccurring indications may be performed as noted. The reflux portion of the exam is performed with the patient in reverse Trendelenburg.  +---------+---------------+---------+-----------+----------+--------------+ RIGHT    CompressibilityPhasicitySpontaneityPropertiesThrombus Aging +---------+---------------+---------+-----------+----------+--------------+ CFV      Full           Yes      Yes                                  +---------+---------------+---------+-----------+----------+--------------+ SFJ      Full                                                        +---------+---------------+---------+-----------+----------+--------------+ FV Prox  Full                                                        +---------+---------------+---------+-----------+----------+--------------+ FV Mid   Full                                                        +---------+---------------+---------+-----------+----------+--------------+  FV DistalFull                                                        +---------+---------------+---------+-----------+----------+--------------+ PFV      Full                                                        +---------+---------------+---------+-----------+----------+--------------+ POP      Full           Yes      Yes                                 +---------+---------------+---------+-----------+----------+--------------+ PTV      Full                                                        +---------+---------------+---------+-----------+----------+--------------+ PERO     Full                                                        +---------+---------------+---------+-----------+----------+--------------+   +---------+---------------+---------+-----------+----------+-----------------+ LEFT     CompressibilityPhasicitySpontaneityPropertiesThrombus Aging    +---------+---------------+---------+-----------+----------+-----------------+ CFV      Full           Yes      Yes                                    +---------+---------------+---------+-----------+----------+-----------------+ SFJ      Full                                                           +---------+---------------+---------+-----------+----------+-----------------+ FV Prox  Full                                                            +---------+---------------+---------+-----------+----------+-----------------+ FV Mid   Full                                                           +---------+---------------+---------+-----------+----------+-----------------+ FV DistalFull                                                           +---------+---------------+---------+-----------+----------+-----------------+  PFV      Full                                                           +---------+---------------+---------+-----------+----------+-----------------+ POP      Full           Yes      Yes                                    +---------+---------------+---------+-----------+----------+-----------------+ PTV      Partial                                      Age Indeterminate +---------+---------------+---------+-----------+----------+-----------------+ PERO     Partial                                      Age Indeterminate +---------+---------------+---------+-----------+----------+-----------------+     Summary: RIGHT: - There is no evidence of deep vein thrombosis in the lower extremity.  LEFT: - Findings consistent with age indeterminate deep vein thrombosis involving the left posterior tibial veins, and left peroneal veins.  *See table(s) above for measurements and observations.    Preliminary     Medications: I have reviewed the patient's current medications.  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 I had a detailed discussion about the procedure, risks and benefits of the procedure with the patient and her first cousin at bedside.  Clear liquid diet today, n.p.o. post midnight. Abdominal x-ray today as patient feels nauseous and is worried about projectile vomiting when her head is lowered down/supine position. Hold heparin drip at 6 AM.   Ronnette Juniper, MD 05/31/2019, 11:07 AM

## 2019-05-31 NOTE — Progress Notes (Signed)
[place] for Heparin Indication: bilateral DVT (Eliquis on hold)  Allergies  Allergen Reactions  . Influenza Vaccines Other (See Comments)    PT STATES SHE GETS THE FLU FROM THE VACCINE    Patient Measurements: Height: 5\' 2"  (157.5 cm) Weight: 81.2 kg (179 lb) IBW/kg (Calculated) : 50.1 Heparin Dosing Weight: 68.2 kg  Vital Signs: Temp: 98.2 F (36.8 C) (04/05 1320) Temp Source: Oral (04/05 1320) BP: 145/68 (04/05 1320) Pulse Rate: 64 (04/05 1320)  Labs: Recent Labs    05/29/19 1238 05/29/19 1238 05/30/19 0939 05/30/19 2300 05/31/19 0411 05/31/19 1833  HGB 11.8*   < > 9.7*  --  8.9*  --   HCT 36.6  --  30.6*  --  28.5*  --   PLT 372  --  291  --  272  --   LABPROT 13.2  --   --   --   --   --   INR 1.0  --   --   --   --   --   HEPARINUNFRC  --   --   --  1.10*  --  0.56  CREATININE 0.65  --  0.47  --  0.41*  --    < > = values in this interval not displayed.    Estimated Creatinine Clearance: 59 mL/min (A) (by C-G formula based on SCr of 0.41 mg/dL (L)).   Medical History: Past Medical History:  Diagnosis Date  . Cancer ([place])   . DM (diabetes mellitus) ([place])   . Seizures ([place])   . Uterine cancer ([facility])     Medications:  Scheduled:  . insulin aspart  0-15 Units Subcutaneous Q4H  . insulin glargine  12 Units Subcutaneous QHS  . sodium chloride flush  10-40 mL Intracatheter Q12H   Infusions:  . sodium chloride 100 mL/hr at 05/30/19 0608  . sodium chloride 20 mL/hr at 05/31/19 0316  . sodium chloride    . heparin Stopped (06/01/19 0600)  . levETIRAcetam 500 mg (05/31/19 1724)  . piperacillin-tazobactam (ZOSYN)  IV 3.375 g (05/31/19 1620)    Assessment: 81 yoF admitted for elevated liver enzymes. Pt was recently [facility]12[facility] for bowel obstruction and found to have bilateral DVT. Pt was sent home on Eliquis starter pack; however, on 3/30 outpt MD Dr. Laurann Montana instructed pt to stop the  medication, so pt has not been on anticoagulation since then. During this admission, a repeat Doppler was done showing age-indeterminate DVT in left leg. Pt is scheduled for ERCP on 4/5. Pharmacy consulted to dose heparin.   Heparin level is now therapeutic on 900 units/hr.  No change needed.  RN is aware of 6AM stop time for procedure 4/6.  Goal of Therapy:  Heparin level 0.3-0.7 units/ml Monitor platelets by anticoagulation protocol: Yes   Plan:  Continue heparin drip at 900 units/hr Heparin stop time 0600 4/6 Follow-up after procedure re: anticoag plans  [employer], Pharm.D., BCPS Clinical Pharmacist  **Pharmacist phone directory can be found on [email or web address] listed under [place].  05/31/2019 7:43 PM

## 2019-05-31 NOTE — Progress Notes (Signed)
CRITICAL VALUE ALERT  Critical Value:  Potassium 2.4  Date & Time Notied:  05/31/2019 0646  Provider Notified: On call [facility] - [place]  Orders Received/Actions taken: New order for Potassium iv x 6 and Magnesium Day shift to follow up.

## 2019-05-31 NOTE — Progress Notes (Signed)
PT Cancellation Note  Patient Details Name: Tracy Odonnell MRN: [id number] DOB: October 01, 1940   Cancelled Treatment:    Reason Eval/Treat Not Completed: Medical issues which prohibited therapy. PT consult received. Upon chart review, pt with low potassium (2.4) outside of APTA and department guidelines for activity. Will hold at this time. PT will follow up for eval at later date/time as able and pt medically appropriate.   Zachary George PT, DPT 7:58 AM,05/31/19    Kendyl Bissonnette Drucilla Chalet 05/31/2019, 7:57 AM

## 2019-05-31 NOTE — Progress Notes (Signed)
PT Cancellation Note  Patient Details Name: SAORY VERONA MRN: [id number] DOB: [birth year]   Cancelled Treatment:    Reason Eval/Treat Not Completed: Medical issues which prohibited therapy. Per communication with nursing, pts potassium still low and working on increasing. Pt also very weak and in a lot of pain. Nursing recommending to continue holding on therapy until pt more appropriate. PT will follow up tomorrow for PT eval.   Zachary George PT, DPT 1:00 PM,05/31/19   Josian Lanese Drucilla Chalet 05/31/2019, 12:59 PM

## 2019-06-01 ENCOUNTER — Inpatient Hospital Stay (HOSPITAL_COMMUNITY): Payer: Medicare HMO

## 2019-06-01 ENCOUNTER — Inpatient Hospital Stay (HOSPITAL_COMMUNITY): Payer: Medicare HMO | Admitting: Anesthesiology

## 2019-06-01 ENCOUNTER — Encounter (HOSPITAL_COMMUNITY): Payer: Self-pay | Admitting: Internal Medicine

## 2019-06-01 ENCOUNTER — Encounter (HOSPITAL_COMMUNITY)
Admission: EM | Disposition: A | Discharge status: Home Health | Payer: Self-pay | Source: Home / Self Care | Attending: Internal Medicine

## 2019-06-01 HISTORY — PX: BILIARY BRUSHING: SHX6843

## 2019-06-01 HISTORY — PX: SPHINCTEROTOMY: SHX5544

## 2019-06-01 HISTORY — PX: FINE NEEDLE ASPIRATION: SHX5430

## 2019-06-01 HISTORY — PX: BILIARY STENT PLACEMENT: SHX5538

## 2019-06-01 HISTORY — PX: UPPER ESOPHAGEAL ENDOSCOPIC ULTRASOUND (EUS): SHX6562

## 2019-06-01 HISTORY — PX: ENDOSCOPIC RETROGRADE CHOLANGIOPANCREATOGRAPHY (ERCP) WITH PROPOFOL: SHX5810

## 2019-06-01 HISTORY — PX: ESOPHAGOGASTRODUODENOSCOPY (EGD) WITH PROPOFOL: SHX5813

## 2019-06-01 LAB — COMPREHENSIVE METABOLIC PANEL
ALT: 249 U/L — ABNORMAL HIGH (ref 0–44)
AST: 162 U/L — ABNORMAL HIGH (ref 15–41)
Albumin: 2.2 g/dL — ABNORMAL LOW (ref 3.5–5.0)
Alkaline Phosphatase: 417 U/L — ABNORMAL HIGH (ref 38–126)
Anion gap: 11 (ref 5–15)
BUN: 7 mg/dL — ABNORMAL LOW (ref 8–23)
CO2: 25 mmol/L (ref 22–32)
Calcium: 8.5 mg/dL — ABNORMAL LOW (ref 8.9–10.3)
Chloride: 103 mmol/L (ref 98–111)
Creatinine, Ser: 0.4 mg/dL — ABNORMAL LOW (ref 0.44–1.00)
GFR calc Af Amer: >60 mL/min (ref >60)
GFR calc non Af Amer: >60 mL/min (ref >60)
Glucose, Bld: 89 mg/dL (ref 70–99)
Potassium: 3.1 mmol/L — ABNORMAL LOW (ref 3.5–5.1)
Sodium: 139 mmol/L (ref 135–145)
Total Bilirubin: 1.8 mg/dL — ABNORMAL HIGH (ref 0.3–1.2)
Total Protein: 4.9 g/dL — ABNORMAL LOW (ref 6.5–8.1)

## 2019-06-01 LAB — CBC
HCT: 28.8 % — ABNORMAL LOW (ref 36.0–46.0)
Hemoglobin: 9.2 g/dL — ABNORMAL LOW (ref 12.0–15.0)
MCH: 31.3 pg (ref 26.0–34.0)
MCHC: 31.9 g/dL (ref 30.0–36.0)
MCV: 98 fL (ref 80.0–100.0)
Platelets: 278 10*3/uL (ref 150–400)
RBC: 2.94 MIL/uL — ABNORMAL LOW (ref 3.87–5.11)
RDW: 16.3 % — ABNORMAL HIGH (ref 11.5–15.5)
WBC: 5.9 10*3/uL (ref 4.0–10.5)
nRBC: 0 % (ref 0.0–0.2)

## 2019-06-01 LAB — GLUCOSE, CAPILLARY
Glucose-Capillary: 108 mg/dL — ABNORMAL HIGH (ref 70–99)
Glucose-Capillary: 115 mg/dL — ABNORMAL HIGH (ref 70–99)
Glucose-Capillary: 127 mg/dL — ABNORMAL HIGH (ref 70–99)
Glucose-Capillary: 143 mg/dL — ABNORMAL HIGH (ref 70–99)
Glucose-Capillary: 68 mg/dL — ABNORMAL LOW (ref 70–99)
Glucose-Capillary: 78 mg/dL (ref 70–99)
Glucose-Capillary: 81 mg/dL (ref 70–99)
Glucose-Capillary: 93 mg/dL (ref 70–99)

## 2019-06-01 SURGERY — UPPER ESOPHAGEAL ENDOSCOPIC ULTRASOUND (EUS)
Anesthesia: General

## 2019-06-01 MED ORDER — ONDANSETRON HCL 4 MG/2ML IJ SOLN
INTRAMUSCULAR | Status: DC | PRN
  Administered 2019-06-01: 4 mg via INTRAVENOUS

## 2019-06-01 MED ORDER — PHENYLEPHRINE 40 MCG/ML (10ML) SYRINGE FOR IV PUSH (FOR BLOOD PRESSURE SUPPORT)
PREFILLED_SYRINGE | INTRAVENOUS | Status: DC | PRN
  Administered 2019-06-01: 120 ug via INTRAVENOUS
  Administered 2019-06-01: 80 ug via INTRAVENOUS
  Administered 2019-06-01: 120 ug via INTRAVENOUS
  Administered 2019-06-01: 40 ug via INTRAVENOUS

## 2019-06-01 MED ORDER — DEXTROSE 50 % IV SOLN
INTRAVENOUS | Status: AC
  Filled 2019-06-01: qty 50

## 2019-06-01 MED ORDER — INDOMETHACIN 50 MG RE SUPP
RECTAL | Status: DC | PRN
  Administered 2019-06-01: 100 mg via RECTAL

## 2019-06-01 MED ORDER — GLUCAGON HCL RDNA (DIAGNOSTIC) 1 MG IJ SOLR
INTRAMUSCULAR | Status: AC
  Filled 2019-06-01: qty 1

## 2019-06-01 MED ORDER — INDOMETHACIN 50 MG RE SUPP
RECTAL | Status: AC
  Filled 2019-06-01: qty 2

## 2019-06-01 MED ORDER — PROPOFOL 10 MG/ML IV BOLUS
INTRAVENOUS | Status: DC | PRN
  Administered 2019-06-01: 150 mg via INTRAVENOUS

## 2019-06-01 MED ORDER — FENTANYL CITRATE (PF) 100 MCG/2ML IJ SOLN
INTRAMUSCULAR | Status: DC | PRN
  Administered 2019-06-01 (×4): 50 ug via INTRAVENOUS

## 2019-06-01 MED ORDER — IOHEXOL 300 MG/ML  SOLN
INTRAMUSCULAR | Status: DC | PRN
  Administered 2019-06-01: 40 mL

## 2019-06-01 MED ORDER — SUCCINYLCHOLINE CHLORIDE 200 MG/10ML IV SOSY
PREFILLED_SYRINGE | INTRAVENOUS | Status: DC | PRN
  Administered 2019-06-01: 120 mg via INTRAVENOUS

## 2019-06-01 MED ORDER — PHENYLEPHRINE HCL-NACL 10-0.9 MG/250ML-% IV SOLN
INTRAVENOUS | Status: DC | PRN
  Administered 2019-06-01: 25 ug/min via INTRAVENOUS

## 2019-06-01 MED ORDER — GLYCOPYRROLATE PF 0.2 MG/ML IJ SOSY
PREFILLED_SYRINGE | INTRAMUSCULAR | Status: DC | PRN
  Administered 2019-06-01: .1 mg via INTRAVENOUS

## 2019-06-01 MED ORDER — DEXTROSE 50 % IV SOLN
25.0000 mL | Freq: Once | INTRAVENOUS | Status: AC
  Administered 2019-06-01: 11:00:00 25 mL via INTRAVENOUS

## 2019-06-01 MED ORDER — HYDROMORPHONE HCL 1 MG/ML IJ SOLN
0.5000 mg | INTRAMUSCULAR | Status: DC | PRN
  Administered 2019-06-01 – 2019-06-05 (×8): 1 mg via INTRAVENOUS
  Filled 2019-06-01 (×8): qty 1

## 2019-06-01 MED ORDER — DEXTROSE 50 % IV SOLN: INTRAVENOUS | Status: DC | PRN

## 2019-06-01 MED ORDER — DEXAMETHASONE SODIUM PHOSPHATE 4 MG/ML IJ SOLN
INTRAMUSCULAR | Status: DC | PRN
  Administered 2019-06-01: 4 mg via INTRAVENOUS

## 2019-06-01 MED ORDER — LIDOCAINE 2% (20 MG/ML) 5 ML SYRINGE
INTRAMUSCULAR | Status: DC | PRN
  Administered 2019-06-01: 60 mg via INTRAVENOUS

## 2019-06-01 MED ORDER — IOPAMIDOL (ISOVUE-M 300) INJECTION 61%: INTRAMUSCULAR | Status: DC | PRN

## 2019-06-01 MED ORDER — LACTATED RINGERS IV SOLN: INTRAVENOUS | Status: DC

## 2019-06-01 MED ORDER — POTASSIUM CHLORIDE 10 MEQ/100ML IV SOLN
10.0000 meq | INTRAVENOUS | Status: AC
  Administered 2019-06-01 (×2): 10 meq via INTRAVENOUS
  Filled 2019-06-01 (×2): qty 100

## 2019-06-01 MED ORDER — HEPARIN (PORCINE) 25000 UT/250ML-% IV SOLN
900.0000 [IU]/h | INTRAVENOUS | Status: AC
  Administered 2019-06-01: 900 [IU]/h via INTRAVENOUS
  Filled 2019-06-01 (×3): qty 250

## 2019-06-01 MED ORDER — GLUCAGON HCL RDNA (DIAGNOSTIC) 1 MG IJ SOLR
INTRAMUSCULAR | Status: DC | PRN
  Administered 2019-06-01: .5 mg via INTRAVENOUS

## 2019-06-01 NOTE — Plan of Care (Signed)
  Problem: Education: Goal: Knowledge of General Education information will improve Description Including pain rating scale, medication(s)/side effects and non-pharmacologic comfort measures Outcome: Progressing   

## 2019-06-01 NOTE — Anesthesia Procedure Notes (Signed)
Procedure Name: Intubation Date/Time: 06/01/2019 11:47 AM Performed by: Orlie Dakin, CRNA Pre-anesthesia Checklist: Patient identified, Emergency Drugs available, Suction available and Patient being monitored Patient Re-evaluated:Patient Re-evaluated prior to induction Oxygen Delivery Method: Circle system utilized Preoxygenation: Pre-oxygenation with 100% oxygen Induction Type: IV induction Laryngoscope Size: Miller and 3 Grade View: Grade I Tube type: Oral Tube size: 7.0 mm Number of attempts: 1 Airway Equipment and Method: Stylet Placement Confirmation: ETT inserted through vocal cords under direct vision,  positive ETCO2 and breath sounds checked- equal and bilateral Secured at: 22 cm Tube secured with: Tape Dental Injury: Teeth and Oropharynx as per pre-operative assessment

## 2019-06-01 NOTE — Anesthesia Preprocedure Evaluation (Addendum)
Anesthesia Evaluation  Patient identified by MRN, date of birth, ID band Patient awake    Reviewed: Allergy & Precautions, NPO status , Patient's Chart, lab work & pertinent test results  Airway Mallampati: II  TM Distance: >3 FB     Dental  (+) Poor Dentition, Dental Advisory Given, Caps,    Pulmonary    breath sounds clear to auscultation       Cardiovascular negative cardio ROS   Rhythm:Regular Rate:Normal     Neuro/Psych    GI/Hepatic Neg liver ROS, History noted. CG   Endo/Other  diabetes  Renal/GU      Musculoskeletal   Abdominal   Peds  Hematology   Anesthesia Other Findings   Reproductive/Obstetrics                            Anesthesia Physical Anesthesia Plan  ASA: III  Anesthesia Plan: General   Post-op Pain Management:    Induction: Intravenous  PONV Risk Score and Plan: 3 and Ondansetron, Dexamethasone and Midazolam  Airway Management Planned: Oral ETT  Additional Equipment:   Intra-op Plan:   Post-operative Plan: Possible Post-op intubation/ventilation  Informed Consent: I have reviewed the patients History and Physical, chart, labs and discussed the procedure including the risks, benefits and alternatives for the proposed anesthesia with the patient or authorized representative who has indicated his/her understanding and acceptance.     Dental advisory given  Plan Discussed with: Anesthesiologist  Anesthesia Plan Comments:         Anesthesia Quick Evaluation

## 2019-06-01 NOTE — Interval H&P Note (Signed)
History and Physical Interval Note: 78/female with pancreatic head mass noted on Mri and obstructive jaundice for an EUS with FNAC and ERCP with stent placement.  06/01/2019 11:02 AM  Tracy Odonnell  has presented today for EUS and ERCP, with the diagnosis of Obstructive jaundice.  The various methods of treatment have been discussed with the patient and family. After consideration of risks, benefits and other options for treatment, the patient has consented to  Procedure(s): UPPER ESOPHAGEAL ENDOSCOPIC ULTRASOUND (EUS) (N/A) ENDOSCOPIC RETROGRADE CHOLANGIOPANCREATOGRAPHY (ERCP) WITH PROPOFOL (N/A) as a surgical intervention.  The patient's history has been reviewed, patient examined, no change in status, stable for surgery.  I have reviewed the patient's chart and labs.  Questions were answered to the patient's satisfaction.     Ronnette Juniper

## 2019-06-01 NOTE — Brief Op Note (Signed)
05/29/2019 - 06/01/2019  1:37 PM  PATIENT:  Tracy Odonnell  79 y.o. female  PRE-OPERATIVE DIAGNOSIS:  Obstructive jaundice  POST-OPERATIVE DIAGNOSIS:  * No post-op diagnosis entered *  PROCEDURE:  Procedure(s): UPPER ESOPHAGEAL ENDOSCOPIC ULTRASOUND (EUS) (N/A) ENDOSCOPIC RETROGRADE CHOLANGIOPANCREATOGRAPHY (ERCP) WITH PROPOFOL (N/A)  SURGEON:  Surgeon(s) and Role: Panel 1:    Arta Silence, MD - Primary Panel 2:    * Ronnette Juniper, MD - Primary  PHYSICIAN ASSISTANT:   ASSISTANTS: Coralie Carpen Parker,Tech   ANESTHESIA:   MAC  EBL:  Minimal  BLOOD ADMINISTERED:none  DRAINS: none   LOCAL MEDICATIONS USED:  NONE  SPECIMEN:  Cytology brushing of distal CBD  DISPOSITION OF SPECIMEN:  Cytology  COUNTS:  YES  TOURNIQUET:  * No tourniquets in log *  DICTATION: .Dragon Dictation  PLAN OF CARE: Admit to inpatient   PATIENT DISPOSITION:  PACU - hemodynamically stable.   Delay start of Pharmacological VTE agent (>24hrs) due to surgical blood loss or risk of bleeding: no

## 2019-06-01 NOTE — Progress Notes (Signed)
PT Cancellation Note  Upon arrival for PT eval, pt off the unit at procedure and unavailable. PT will follow up at later date/time as able and pt appropriate.   Zachary George PT, DPT 12:33 PM,06/01/19

## 2019-06-01 NOTE — Progress Notes (Signed)
PROGRESS NOTE    Tracy Odonnell  [id number] DOB: Dec 10, 1940 DOA: 05/29/2019 PCP: Lavone Orn, MD   Brief Narrative:  HPI on 05/29/2018 by Dr. Early Osmond Tracy Odonnell is a 79 y.o. female with medical history significant of insulin-dependent diabetes mellitus, seizure, uterine cancer status post hysterectomy, obesity presents to emergency department due to right upper quadrant pain and vomiting since 2 days.  Vomitus is bilious, dark brown/green in color, copious amount, too many episodes to count, associated with right upper quadrant pain, generalized pruritus, decreased appetite.  No history of dysuria, hematuria, fever, hematemesis, melena.  Reports constipation for which she has been taking stool softeners with little to no help.  She tells me that last night she fell out of her bed trying to put some lotion on her nightstand.  States that she did strike her face against the nightstand.  Denies loss of consciousness.  Interim history Admitted with obstructive jaundice and elevated LFTs.  Gastroenterology consulted and appreciated, planning for ERCP and EUS today 06/01/2019.  Assessment & Plan   Obstructive jaundice with elevated LFTs -Patient with history of cholecystectomy -CT abdomen pelvis showed intrahepatic bile duct dilation extending into the common hepatic duct -MRCP showed severe intra and extrahepatic biliary ductal dilatation to the pancreatic head, with severe dilatation of the pancreatic duct in the pancreatic neck and more distally.  Constellation of findings suspicious for pancreatic adenocarcinoma. -Gastroenterology consulted and appreciated-planning for ERCP and EUS on 06/01/2019 -Continue to monitor LFTs- trending downward -Continue IV fluids and Zosyn as well as antiemetics  Diabetes mellitus, type II -Hemoglobin A1c 7.7 -Continue Lantus, insulin sliding scale and CBG monitoring  Elevated anion gap -Possibly be secondary to the above -Resolved  Elevated  blood pressure without history of hypertension -Possibly secondary to pain -Continue labetalol as needed  Seizure disorder -continue Keppra cautions  History of bilateral lower extremity DVT -Lower extremity Doppler showed L age-indeterminate DVT of posterior tibial, peroneal veins.  Right no evidence of DVT. -Doppler on 05/16/2019 showed acute DVT of the right and left posterior tibial veins-patient was placed on Eliquis -Recently, patient discontinued Eliquis at the recommendation of her PCP, unsure as to why. -Continue heparin  Mechanical fall -Patient with left eye bruising -CT head unremarkable for acute finding -Continue fall -PT consulted and pending   Chronic normocytic anemia -last hemoglobin 9.8 on 05/13/2019 -Currently hemoglobin 8.9 -Continue to monitor CBC  Hypokalemia -Potassium improved to 3.1, continue to replace and monitor BMP -Magnesium obtain, 1.8  DVT Prophylaxis  heparin  Code Status: Full  Family Communication: Cousin at bedside  Disposition Plan: Admitted from home for obstructive jaundice.  Pending further gastroenterology recommendations and work-up.  Disposition likely home.  Consultants Gastroenterology  Procedures  MRCP  Antibiotics   Anti-infectives (From admission, onward)   Start     Dose/Rate Route Frequency Ordered Stop   05/29/19 1700  piperacillin-tazobactam (ZOSYN) IVPB 3.375 g     3.375 g 12.5 mL/hr over 240 Minutes Intravenous Every 8 hours 05/29/19 1652        Subjective:   Tracy Odonnell seen and examined today.  Continues to have nausea and concerned about the amount of medications she is taking. Denies current vomiting, abdominal pain. Denies chest pain, shortness of breath, dizziness, headache.   Objective:   Vitals:   05/31/19 0906 05/31/19 1320 05/31/19 2030 06/01/19 0436  BP: (!) 145/62 (!) 145/68 (!) 139/55 (!) 154/60  Pulse: (!) 59 64 65 63  Resp: 18 18 15  15  Temp: 98.7 F (37.1 C) 98.2 F (36.8 C) 98.2 F  (36.8 C) 98.5 F (36.9 C)  TempSrc: Oral Oral Oral Oral  SpO2: 99% 100% 97% 97%  Weight:      Height:        Intake/Output Summary (Last 24 hours) at 06/01/2019 0849 Last data filed at 06/01/2019 [id number] Gross per 24 hour  Intake 2060 ml  Output --  Net 2060 ml   Filed Weights   05/29/19 1224  Weight: 81.2 kg   Exam  General: Well developed, well nourished, NAD, appears stated age  HEENT: NCAT, PERRLA, EOMI, Icteric sclera, mucous membranes moist. Right periorbital bruising/laceration.   Cardiovascular: S1 S2 auscultated, RRR, no murrmur  Respiratory: Clear to auscultation bilaterally   Abdomen: Soft, nontender, nondistended, + bowel sounds  Extremities: warm dry without cyanosis clubbing or edema  Neuro: AAOx3, nonfoacl  Psych: Appropriate mood and affect, pleasant   Data Reviewed: I have personally reviewed following labs and imaging studies  CBC: Recent Labs  Lab 05/29/19 1238 05/30/19 0939 05/31/19 0411 06/01/19 0417  WBC 10.6* 9.6 6.8 5.9  NEUTROABS 9.7*  --   --   --   HGB 11.8* 9.7* 8.9* 9.2*  HCT 36.6 30.6* 28.5* 28.8*  MCV 95.1 98.4 98.6 98.0  PLT 372 291 272 0000000   Basic Metabolic Panel: Recent Labs  Lab 05/29/19 1238 05/29/19 1648 05/30/19 0939 05/31/19 0411 05/31/19 0656 06/01/19 0417  NA 138  --  143 140  --  139  K 4.1  --  3.8 2.4*  --  3.1*  CL 92*  --  103 104  --  103  CO2 29  --  30 27  --  25  GLUCOSE 320*  --  153* 116*  --  89  BUN 16  --  13 9  --  7*  CREATININE 0.65  --  0.47 0.41*  --  0.40*  CALCIUM 9.7  --  8.7* 8.1*  --  8.5*  MG  --  2.0  --   --  1.8  --    GFR: Estimated Creatinine Clearance: 59 mL/min (A) (by C-G formula based on SCr of 0.4 mg/dL (L)). Liver Function Tests: Recent Labs  Lab 05/29/19 1238 05/30/19 0939 05/31/19 0411 06/01/19 0417  AST 393* 312* 228* 162*  ALT 444* 351* 289* 249*  ALKPHOS 782* 562* 443* 417*  BILITOT 4.1* 2.7* 2.0* 1.8*  PROT 6.9 5.1* 4.6* 4.9*  ALBUMIN 3.3* 2.5* 2.2* 2.2*    Recent Labs  Lab 05/29/19 1238  LIPASE 40   Recent Labs  Lab 05/29/19 1238  AMMONIA 36*   Coagulation Profile: Recent Labs  Lab 05/29/19 1238  INR 1.0   Cardiac Enzymes: No results for input(s): CKTOTAL, CKMB, CKMBINDEX, TROPONINI in the last 168 hours. BNP (last 3 results) No results for input(s): PROBNP in the last 8760 hours. HbA1C: Recent Labs    05/29/19 1648  HGBA1C 7.7*   CBG: Recent Labs  Lab 05/31/19 1651 05/31/19 2026 06/01/19 0024 06/01/19 0431 06/01/19 0738  GLUCAP 135* 134* 93 78 81   Lipid Profile: No results for input(s): CHOL, HDL, LDLCALC, TRIG, CHOLHDL, LDLDIRECT in the last 72 hours. Thyroid Function Tests: No results for input(s): TSH, T4TOTAL, FREET4, T3FREE, THYROIDAB in the last 72 hours. Anemia Panel: No results for input(s): VITAMINB12, FOLATE, FERRITIN, TIBC, IRON, RETICCTPCT in the last 72 hours. Urine analysis:    Component Value Date/Time   COLORURINE AMBER (A) 05/29/2019 2225  APPEARANCEUR CLEAR 05/29/2019 2225   LABSPEC >1.046 (H) 05/29/2019 2225   PHURINE 5.0 05/29/2019 2225   GLUCOSEU >=500 (A) 05/29/2019 2225   HGBUR NEGATIVE 05/29/2019 2225   BILIRUBINUR NEGATIVE 05/29/2019 2225   KETONESUR 80 (A) 05/29/2019 2225   PROTEINUR NEGATIVE 05/29/2019 2225   NITRITE NEGATIVE 05/29/2019 2225   LEUKOCYTESUR TRACE (A) 05/29/2019 2225   Sepsis Labs: @LABRCNTIP (procalcitonin:4,lacticidven:4)  ) Recent Results (from the past 240 hour(s))  SARS CORONAVIRUS 2 (TAT 6-24 HRS) Nasopharyngeal Nasopharyngeal Swab     Status: None   Collection Time: 05/29/19  4:39 PM   Specimen: Nasopharyngeal Swab  Result Value Ref Range Status   SARS Coronavirus 2 NEGATIVE NEGATIVE Final    Comment: (NOTE) SARS-CoV-2 target nucleic acids are NOT DETECTED. The SARS-CoV-2 RNA is generally detectable in upper and lower respiratory specimens during the acute phase of infection. Negative results do not preclude SARS-CoV-2 infection, do not rule  out co-infections with other pathogens, and should not be used as the sole basis for treatment or other patient management decisions. Negative results must be combined with clinical observations, patient history, and epidemiological information. The expected result is Negative. Fact Sheet for Patients: SugarRoll.be Fact Sheet for Healthcare Providers: https://www.woods-mathews.com/ This test is not yet approved or cleared by the [place] FDA and  has been authorized for detection and/or diagnosis of SARS-CoV-2 by FDA under an Emergency Use Authorization (EUA). This EUA will remain  in effect (meaning this test can be used) for the duration of the COVID-19 declaration under Section 56 4(b)(1) of the Act, 21 U.S.C. section 360bbb-3(b)(1), unless the authorization is terminated or revoked sooner. Performed at [facility], [place] [address] [place], [place] [address]   Culture, blood (routine x 2)     Status: None (Preliminary result)   Collection Time: 05/29/19  4:48 PM   Specimen: BLOOD  Result Value Ref Range Status   Specimen Description BLOOD RIGHT ARM  Final   Special Requests   Final    BOTTLES DRAWN AEROBIC AND ANAEROBIC Blood Culture adequate volume   Culture   Final    NO GROWTH 3 DAYS Performed at [facility], [place] [address], [place], [place] [address]    Report Status PENDING  Incomplete  Culture, blood (routine x 2)     Status: None (Preliminary result)   Collection Time: 05/29/19  6:23 PM   Specimen: BLOOD  Result Value Ref Range Status   Specimen Description BLOOD RIGHT ARM  Final   Special Requests   Final    BOTTLES DRAWN AEROBIC ONLY Blood Culture adequate volume   Culture   Final    NO GROWTH 3 DAYS Performed at [facility], [address], [place], [place] [address]    Report Status PENDING  Incomplete      Radiology Studies: DG Abd 1 View  Result Date: 05/31/2019 CLINICAL DATA:  Recent  bowel obstruction.  Nausea. EXAM: ABDOMEN - 1 VIEW COMPARISON:  May 17, 2019 FINDINGS: There is moderate stool in the colon. There is no bowel dilatation or air-fluid level to suggest bowel obstruction. No free air. There are surgical clips in the right upper quadrant. There is degenerative change in the lumbar spine. IMPRESSION: No bowel obstruction or free air.  Moderate stool in colon. Electronically Signed   By: Lowella Grip III M.D.   On: 05/31/2019 12:48   VAS [place] LOWER EXTREMITY VENOUS (DVT)  Result Date: 05/31/2019  Lower Venous DVT Study  Indications: Swelling.  Risk Factors: Recently diagnosed with bilateral lower extremity calf DVT at [facility], patient only took Eliquis for approximately 1 week before discontinuing medicine herself. Limitations: Patient's pain and holding of breath with compression,. Performing Technologist: Sharion Dove RVS  Examination Guidelines: A complete evaluation includes B-mode imaging, spectral Doppler, color Doppler, and power Doppler as needed of all accessible portions of each vessel. Bilateral testing is considered an integral part of a complete examination. Limited examinations for reoccurring indications may be performed as noted. The reflux portion of the exam is performed with the patient in reverse Trendelenburg.  +---------+---------------+---------+-----------+----------+--------------+ RIGHT    CompressibilityPhasicitySpontaneityPropertiesThrombus Aging +---------+---------------+---------+-----------+----------+--------------+ CFV      Full           Yes      Yes                                 +---------+---------------+---------+-----------+----------+--------------+ SFJ      Full                                                        +---------+---------------+---------+-----------+----------+--------------+ FV Prox  Full                                                         +---------+---------------+---------+-----------+----------+--------------+ FV Mid   Full                                                        +---------+---------------+---------+-----------+----------+--------------+ FV DistalFull                                                        +---------+---------------+---------+-----------+----------+--------------+ PFV      Full                                                        +---------+---------------+---------+-----------+----------+--------------+ POP      Full           Yes      Yes                                 +---------+---------------+---------+-----------+----------+--------------+ PTV      Full                                                        +---------+---------------+---------+-----------+----------+--------------+ PERO     Full                                                        +---------+---------------+---------+-----------+----------+--------------+   +---------+---------------+---------+-----------+----------+-----------------+  LEFT     CompressibilityPhasicitySpontaneityPropertiesThrombus Aging    +---------+---------------+---------+-----------+----------+-----------------+ CFV      Full           Yes      Yes                                    +---------+---------------+---------+-----------+----------+-----------------+ SFJ      Full                                                           +---------+---------------+---------+-----------+----------+-----------------+ FV Prox  Full                                                           +---------+---------------+---------+-----------+----------+-----------------+ FV Mid   Full                                                           +---------+---------------+---------+-----------+----------+-----------------+ FV DistalFull                                                            +---------+---------------+---------+-----------+----------+-----------------+ PFV      Full                                                           +---------+---------------+---------+-----------+----------+-----------------+ POP      Full           Yes      Yes                                    +---------+---------------+---------+-----------+----------+-----------------+ PTV      Partial                                      Age Indeterminate +---------+---------------+---------+-----------+----------+-----------------+ PERO     Partial                                      Age Indeterminate +---------+---------------+---------+-----------+----------+-----------------+     Summary: RIGHT: - There is no evidence of deep vein thrombosis in the lower extremity.  LEFT: - Findings consistent with age indeterminate deep vein thrombosis involving the left posterior tibial veins, and left peroneal veins.  *See table(s) above for measurements and observations. Electronically signed by Monica Martinez MD on  05/31/2019 at 5:24:48 PM.    Final      Scheduled Meds: . insulin aspart  0-15 Units Subcutaneous Q4H  . insulin glargine  12 Units Subcutaneous QHS  . sodium chloride flush  10-40 mL Intracatheter Q12H   Continuous Infusions: . sodium chloride 100 mL/hr at 06/01/19 0751  . sodium chloride 20 mL/hr at 05/31/19 0316  . sodium chloride    . levETIRAcetam 500 mg (06/01/19 0532)  . piperacillin-tazobactam (ZOSYN)  IV 3.375 g (06/01/19 0748)  . potassium chloride 10 mEq (06/01/19 0750)     LOS: 3 days   Time Spent in minutes   30 minutes  Ethel Veronica D.O. on 06/01/2019 at 8:49 AM  Between 7am to 7pm - Please see pager noted on [email or web address]  After 7pm go to [email or web address]  And look for the night coverage person covering for me after hours  [facility] Office  [phone]

## 2019-06-01 NOTE — Evaluation (Signed)
Physical Therapy Evaluation Patient Details Name: Tracy Odonnell MRN: [id number] DOB: December 09, 1940 Today's Date: 06/01/2019   History of Present Illness  Tracy Odonnell is a 79 y.o. female with medical history significant of insulin-dependent diabetes mellitus, seizure, uterine cancer status post hysterectomy, obesity presents to emergency department due to right upper quadrant pain and vomiting since 2 days. Pt states she fell out of her bed trying to put some lotion on her nightstand (05/28/19).  States that she did strike her face against the nightstand.  Denies loss of consciousness.  Clinical Impression  Pt is a 79 yo female admitted for above. Pt received resting in bed with two family members present. Pt very lethargic and fatigued from recent procedure and medications but agreeable to attempt therapy. Pt reports living alone but has assist from family nearby. Pt previously independent with all ADLs and ambulating without device. Pt required min A to get EOB and light min A from elevated surface to stand with RW x2 trials. Pt ambulated a few feet with min guard assist and increased reliance on RW. Pt extremely slow moving during mobility but suspect mostly due to medications and lethargy. Pt ambulating with very slow gait speed and decreased stride length and clearance. Ambulation tolerance limited this session due to fatigue and pt beginning to feel woozy. Pt then transferred to recliner to sit upright for lunch tray with min A. Pt required cuing throughout transfers and ambulation for RW mgt with good carryover. Pt educated on some LE therex she can do on her own to prevent deconditioning. Pt presents with dec strength, balance, power and endurance limiting functional mobility and would benefit from further acute therapy to promote optimal return to PLOF. At this time based on limited session today recommendation for HHPT along with 24 hour supervision from caregivers for improved safety. PT will continue  to reassess as pt progresses.     Follow Up Recommendations Home health PT;Supervision/Assistance - 24 hour    Equipment Recommendations  None recommended by PT    Recommendations for Other Services       Precautions / Restrictions Precautions Precautions: Fall Restrictions Weight Bearing Restrictions: No      Mobility  Bed Mobility Overal bed mobility: Needs Assistance Bed Mobility: Sit to Supine       Sit to supine: Min assist;HOB elevated   General bed mobility comments: light min A to get EOB, sig increased time and effort, suspect mostly due to such fatigue and lethargy and recent pain meds  Transfers Overall transfer level: Needs assistance Equipment used: Rolling walker (2 wheeled) Transfers: Sit to/from [facility] Sit to Stand: Min assist;From elevated surface Stand pivot transfers: Min assist       General transfer comment: x2 trials from bed slightly elevated, cuing for hand placement and use of RW, min A to power to upright standing, pt utilizing rocking for momentum, min A for stand pivot from bed to recliner  Ambulation/Gait Ambulation/Gait assistance: Min guard Gait Distance (Feet): 5 Feet Assistive device: Rolling walker (2 wheeled) Gait Pattern/deviations: Step-through pattern Gait velocity: dec   General Gait Details: pt ambulated a few feet away from the bed and backwards back, increased reliance on RW with min cuing for RW mgt, no buckling, unsteadiness or overt LOB noted, very slow moving  Stairs            Wheelchair Mobility    Modified Rankin (Stroke Patients Only)       Balance Overall balance assessment:  Mild deficits observed, not formally tested;History of Falls(pt steady EOB with feet supported, reliant on RW for standing at this time)                                           Pertinent Vitals/Pain Pain Assessment: Faces Faces Pain Scale: Hurts whole lot Pain Location: did not  specify Pain Descriptors / Indicators: Grimacing;Discomfort;Sore Pain Intervention(s): Limited activity within patient's tolerance;Premedicated before session;Monitored during session;Repositioned    Home Living Family/patient expects to be discharged to:: Private residence Living Arrangements: Alone Available Help at Discharge: Family;Available PRN/intermittently Type of Home: House Home Access: Level entry     Home Layout: One level Home Equipment: Tub bench;Walker - 2 wheels;Bedside commode      Prior Function Level of Independence: Independent         Comments: severall falls recently with one leading to current admission, previously no other falls and independent     Hand Dominance        Extremity/Trunk Assessment   Upper Extremity Assessment Upper Extremity Assessment: Generalized weakness    Lower Extremity Assessment Lower Extremity Assessment: Generalized weakness(able to perform SLR and heel slide ind, specific MMTs deferred)       Communication   Communication: No difficulties  Cognition Arousal/Alertness: Lethargic;Suspect due to medications Behavior During Therapy: [facility] for tasks assessed/performed Overall Cognitive Status: Within Functional Limits for tasks assessed                                        General Comments General comments (skin integrity, edema, etc.): VSS on RA    Exercises Other Exercises Other Exercises: pt educated and encouraged to perform LE therex for continued strengthening for prevention of deconditioning   Assessment/Plan    PT Assessment Patient needs continued PT services  PT Problem List Decreased strength;Decreased mobility;Decreased range of motion;Decreased activity tolerance;Decreased balance;Decreased knowledge of use of DME;Pain       PT Treatment Interventions DME instruction;Therapeutic exercise;Gait training;Balance training;Stair training;Neuromuscular re-education;Functional mobility  training;Therapeutic activities;Patient/family education    PT Goals (Current goals can be found in the Care Plan section)  Acute Rehab PT Goals Patient Stated Goal: feel better PT Goal Formulation: With patient Time For Goal Achievement: 06/15/19 Potential to Achieve Goals: Good    Frequency Min 3X/week   Barriers to discharge Decreased caregiver support      Co-evaluation               AM-PAC PT "6 Clicks" Mobility  Outcome Measure Help needed turning from your back to your side while in a flat bed without using bedrails?: A Little Help needed moving from lying on your back to sitting on the side of a flat bed without using bedrails?: A Little Help needed moving to and from a bed to a chair (including a wheelchair)?: A Little Help needed standing up from a chair using your arms (e.g., wheelchair or bedside chair)?: A Little Help needed to walk in hospital room?: A Little Help needed climbing 3-5 steps with a railing? : A Lot 6 Click Score: 17    End of Session Equipment Utilized During Treatment: Gait belt Activity Tolerance: Patient limited by fatigue;Patient limited by lethargy Patient left: in chair;with call bell/phone within reach;with family/visitor present;with nursing/sitter in room Nurse Communication: Mobility  status PT Visit Diagnosis: Muscle weakness (generalized) (M62.81);Difficulty in walking, not elsewhere classified (R26.2)    Time: [id number] PT Time Calculation (min) (ACUTE ONLY): 41 min   Charges:   PT Evaluation $PT Eval Moderate Complexity: 1 Mod PT Treatments $Therapeutic Activity: 23-37 mins        Lecia Esperanza PT, DPT 4:09 PM,06/01/19   Ayaan Shutes Drucilla Chalet 06/01/2019, 4:02 PM

## 2019-06-01 NOTE — Op Note (Signed)
[facility] Patient Name: Tracy Odonnell Procedure Date : 06/01/2019 MRN: [id number] Attending MD: Arta Silence , MD Date of Birth: Jan 16, 1941 CSN: [id number] Age: 79 Admit Type: Inpatient Procedure:                Upper EUS Indications:              Common bile duct dilation (acquired) seen on MRI,                            Elevated liver enzymes Providers:                Arta Silence, MD, Benetta Spar RN, RN, Cletis Athens, Technician, Luciana Axe, CRNA Referring MD:              Medicines:                General Anesthesia, Zosyn [id number] g Complications:            No immediate complications. Estimated Blood Loss:     Estimated blood loss was minimal. Procedure:                Pre-Anesthesia Assessment:                           - Prior to the procedure, a History and Physical                            was performed, and patient medications and                            allergies were reviewed. The patient's tolerance of                            previous anesthesia was also reviewed. The risks                            and benefits of the procedure and the sedation                            options and risks were discussed with the patient.                            All questions were answered, and informed consent                            was obtained. Prior Anticoagulants: The patient has                            taken heparin, last dose was 6 hours. ASA Grade                            Assessment: III - A patient with severe systemic  disease. After reviewing the risks and benefits,                            the patient was deemed in satisfactory condition to                            undergo the procedure.                           After obtaining informed consent, the endoscope was                            passed under direct vision. Throughout the                            procedure, the  patient's blood pressure, pulse, and                            oxygen saturations were monitored continuously. The                            [id number]) Olympus Linear EUS scope was                            introduced through the mouth, and advanced to the                            second part of duodenum. The upper EUS was                            accomplished without difficulty. The patient                            tolerated the procedure well. Scope In: Scope Out: Findings:      ENDOSONOGRAPHIC FINDING: :      Diffuse intrahepatic biliary ductal dilatation noted. No mass seen in       left lobe of liver. No liver lesion seen in left lobe of liver.      Evidence of a previous cholecystectomy was identified       endosonographically.      No lymphadenopathy seen.      There was dilation in the common bile duct which measured up to 12 mm.       Appears to be soft tissue ingrowth into the mid to distal extrahepatic       intrapancreatic common bile duct.      Extensive changes of chronic pancreatitis (hyperechoic strands/foci,       lobularity, parenchymal calcifications), most prominent in head of       pancreas; in body/tail of pancreas, on the other hand, most prominent       was extensive pancreatic main ductal dilatation (1cm) and abundant       visible PD side branches, with very minimal pancreatic parenchyma noted.       A possible irregular mass was identified in the pancreatic head. The       mass was hypoechoic. The mass measured 15 mm by  15 mm in maximal       cross-sectional diameter. The endosonographic borders were       poorly-defined. Fine needle aspiration for cytology was performed. Color       Doppler imaging was utilized prior to needle puncture to confirm a lack       of significant vascular structures within the needle path. Five passes       were made with the 25 gauge needle using a transduodenal approach. A       stylet was used. A  cytotechnologist was present to evaluate the adequacy       of the specimen. Final cytology results are pending. No obvious vascular       involvement noted, but hard to precisely tell given extensive shadowing       artifact from patient's pancreatic parenchymal calcifications in head of       pancreas.      Mild-to-moderate perihepatic ascites seen. Diffuse irregular thickening       of the wall of the distal stomach and proximal duodenum. Impression:               - Evidence of a cholecystectomy.                           - There was dilation in the common bile duct which                            measured up to 12 mm.                           - Extensive changes of chronic pancreatitis.                           - Perihepatic ascites and extensive soft tissue                            swelling of antral and proximal duodenal mucosa.                           - A possible subtle infiltrative-type mass was                            identified in the pancreatic head. Fine needle                            aspiration performed. Moderate Sedation:      None Recommendation:           - Await cytology results.                           - Perform an ERCP today. Procedure Code(s):        --- Professional ---                           [phone], Esophagogastroduodenoscopy, flexible,                            transoral; with transendoscopic ultrasound-guided  intramural or transmural fine needle                            aspiration/biopsy(s) (includes endoscopic                            ultrasound examination of the esophagus, stomach,                            and either the duodenum or a surgically altered                            stomach where the jejunum is examined distal to the                            anastomosis) Diagnosis Code(s):        --- Professional ---                           Z90.49, Acquired absence of other specified parts                             of digestive tract                           K83.8, Other specified diseases of biliary tract                           K86.89, Other specified diseases of pancreas                           R74.8, Abnormal levels of other serum enzymes CPT copyright 2019 American [employer] Association. All rights reserved. The codes documented in this report are preliminary and upon coder review may  be revised to meet current compliance requirements. Arta Silence, MD 06/01/2019 12:53:34 PM This report has been signed electronically. Number of Addenda: 0

## 2019-06-01 NOTE — Transfer of Care (Signed)
Immediate Anesthesia Transfer of Care Note  Patient: Kirsta H Dibble  Procedure(s) Performed: UPPER ESOPHAGEAL ENDOSCOPIC ULTRASOUND (EUS) (N/A ) ENDOSCOPIC RETROGRADE CHOLANGIOPANCREATOGRAPHY (ERCP) WITH PROPOFOL (N/A )  Patient Location: PACU  Anesthesia Type:General  Level of Consciousness: oriented, drowsy and patient cooperative  Airway & Oxygen Therapy: Patient Spontanous Breathing and Patient connected to nasal cannula oxygen  Post-op Assessment: Report given to RN and Post -op Vital signs reviewed and stable  Post vital signs: Reviewed  Last Vitals:  Vitals Value Taken Time  BP 166/57 06/01/19 1341  Temp 36.8 C 06/01/19 1340  Pulse 77 06/01/19 1349  Resp 18 06/01/19 1349  SpO2 93 % 06/01/19 1349  Vitals shown include unvalidated device data.  Last Pain:  Vitals:   06/01/19 1340  TempSrc: Oral  PainSc: 0-No pain      Patients Stated Pain Goal: 2 ([id number])  Complications: No apparent anesthesia complications

## 2019-06-01 NOTE — Progress Notes (Signed)
[place] for Heparin Indication: bilateral DVT (Eliquis on hold)  Allergies  Allergen Reactions  . Influenza Vaccines Other (See Comments)    PT STATES SHE GETS THE FLU FROM THE VACCINE    Patient Measurements: Height: 5\' 2"  (157.5 cm) Weight: 81.2 kg (179 lb) IBW/kg (Calculated) : 50.1 Heparin Dosing Weight: 68.2 kg  Vital Signs: Temp: 97.9 F (36.6 C) (04/06 1437) Temp Source: Oral (04/06 1437) BP: 161/69 (04/06 1437) Pulse Rate: 70 (04/06 1437)  Labs: Recent Labs    05/30/19 0939 05/30/19 0939 05/30/19 2300 05/31/19 0411 05/31/19 1833 06/01/19 0417  HGB 9.7*   < >  --  8.9*  --  9.2*  HCT 30.6*  --   --  28.5*  --  28.8*  PLT 291  --   --  272  --  278  HEPARINUNFRC  --   --  1.10*  --  0.56  --   CREATININE 0.47  --   --  0.41*  --  0.40*   < > = values in this interval not displayed.    Estimated Creatinine Clearance: 59 mL/min (A) (by C-G formula based on SCr of 0.4 mg/dL (L)).   Medical History: Past Medical History:  Diagnosis Date  . Cancer ([place])   . DM (diabetes mellitus) ([place])   . Seizures ([place])   . Uterine cancer ([facility])     Medications:  Scheduled:  . insulin aspart  0-15 Units Subcutaneous Q4H  . insulin glargine  12 Units Subcutaneous QHS  . sodium chloride flush  10-40 mL Intracatheter Q12H   Infusions:  . sodium chloride 100 mL/hr at 06/01/19 1437  . heparin    . levETIRAcetam 500 mg (06/01/19 0532)  . piperacillin-tazobactam (ZOSYN)  IV 3.375 g (06/01/19 0748)    Assessment: 62 yoF admitted for elevated liver enzymes. Pt was recently admitted to [facility] 3/12-3/22 for bowel obstruction and found to have bilateral DVT. Pt was sent home on Eliquis starter pack; however, on 3/30 outpt MD Dr. Laurann Montana instructed pt to stop the medication, so pt has not been on anticoagulation since then. During this admission, a repeat Doppler was done showing age-indeterminate DVT in left leg. Pharmacy consulted  to dose heparin.   Heparin level was therapeutic last night on 900 units/hr.  Heparin drip stopped at 0600 today for scheduled EUS and ERCP.   Hgb 9.2 stable, pltc wnl Now heparin drip resumed per GI post procedure.    Goal of Therapy:  Heparin level 0.3-0.7 units/ml Monitor platelets by anticoagulation protocol: Yes   Plan:  Resume heparin drip at 900 units/hr Check 8 hr heparin level Daily HL, CBC Monitor for s/sx of bleeding F/u for restart of PTA   Thank you for allowing pharmacy to be part of this patients care team.  Nicole Cella, [facility] Clinical Pharmacist **Pharmacist phone directory can be found on [email or web address] listed under [place]. 06/01/2019 2:54 PM

## 2019-06-01 NOTE — Anesthesia Postprocedure Evaluation (Signed)
Anesthesia Post Note  Patient: Tracy Odonnell  Procedure(s) Performed: UPPER ESOPHAGEAL ENDOSCOPIC ULTRASOUND (EUS) (N/A ) ENDOSCOPIC RETROGRADE CHOLANGIOPANCREATOGRAPHY (ERCP) WITH PROPOFOL (N/A )     Patient location during evaluation: PACU Anesthesia Type: General Level of consciousness: awake and alert Pain management: pain level controlled Vital Signs Assessment: post-procedure vital signs reviewed and stable Respiratory status: spontaneous breathing, nonlabored ventilation, respiratory function stable and patient connected to nasal cannula oxygen Cardiovascular status: blood pressure returned to baseline and stable Postop Assessment: no apparent nausea or vomiting Anesthetic complications: no    Last Vitals:  Vitals:   06/01/19 1400 06/01/19 1437  BP: (!) 151/57 (!) 161/69  Pulse: 77 70  Resp: 18 18  Temp:  36.6 C  SpO2: 95% 94%    Last Pain:  Vitals:   06/01/19 1437  TempSrc: Oral  PainSc:                  Effie Berkshire

## 2019-06-01 NOTE — Op Note (Signed)
[facility] Patient Name: Tracy Odonnell Procedure Date : 06/01/2019 MRN: [id number] Attending MD: Ronnette Juniper , MD Date of Birth: 1940/07/04 CSN: [id number] Age: 79 Admit Type: Inpatient Procedure:                ERCP Indications:              Jaundice, Malignant tumor of the head of pancreas Providers:                Ronnette Juniper, MD, Benetta Spar RN, RN, Cletis Athens,                            Technician, Luciana Axe, CRNA Referring MD:             [facility] Medicines:                Monitored Anesthesia Care Complications:            No immediate complications. Estimated Blood Loss:     Estimated blood loss: none. Procedure:                Pre-Anesthesia Assessment:                           - Prior to the procedure, a History and Physical                            was performed, and patient medications and                            allergies were reviewed. The patient's tolerance of                            previous anesthesia was also reviewed. The risks                            and benefits of the procedure and the sedation                            options and risks were discussed with the patient.                            All questions were answered, and informed consent                            was obtained. Prior Anticoagulants: The patient has                            taken heparin, last dose was 6 hours. ASA Grade                            Assessment: III - A patient with severe systemic                            disease. After reviewing the risks and benefits,  the patient was deemed in satisfactory condition to                            undergo the procedure.                           - Prior to the procedure, a History and Physical                            was performed, and patient medications and                            allergies were reviewed. The patient's tolerance of                             previous anesthesia was also reviewed. The risks                            and benefits of the procedure and the sedation                            options and risks were discussed with the patient.                            All questions were answered, and informed consent                            was obtained. Prior Anticoagulants: The patient has                            taken heparin, last dose was day of procedure. ASA                            Grade Assessment: III - A patient with severe                            systemic disease. After reviewing the risks and                            benefits, the patient was deemed in satisfactory                            condition to undergo the procedure.                           After obtaining informed consent, the scope was                            passed under direct vision. Throughout the                            procedure, the patient's blood pressure, pulse, and  oxygen saturations were monitored continuously. The                            [id number]) Olympus Duodensocope was                            introduced through the mouth, and used to inject                            contrast into and used to cannulate the bile duct.                            The ERCP was technically difficult and complex due                            to edematous duodenal mucosa, possible tumor                            infiltration. The patient tolerated the procedure                            well. Scope In: Scope Out: Findings:      The scout film was normal. The esophagus was successfully intubated       under direct vision. The scope was advanced to a normal major papilla in       the descending duodenum without detailed examination of the pharynx,       larynx and associated structures, and upper GI tract.      Significant edema was noted in the duodenal bulb and descending duodenum.      Small  amount of retained fluid was suctioned from the gastric cavity.      Limited view of esophagus showed changes of esophagitis.      The bile duct was deeply cannulated with the sphincterotome. Contrast       was injected. I personally interpreted the bile duct images. There was       brisk flow of contrast through the ducts. Image quality was excellent.       Contrast extended to the main bile duct. The upper third of the main       bile duct was markedly dilated. The largest diameter was 12 mm. A       cholecystectomy had been performed.      The mid and distal CBD appeared narrow, possibly from growth within the       duct( as previously noted on EUS).      A straight Roadrunner wire was passed into the biliary tree. An 8 mm       biliary sphincterotomy was made with a braided sphincterotome using ERBE       electrocautery. There was no post-sphincterotomy bleeding.      The biliary tree was swept with a 12 mm balloon starting at the       bifurcation, however, the balloon could not be used to sweep the mid or       distal CBD even at 9 mm size, likely due to narrowed CBD lumen, possible       tumor growth within the CBD. Nothing was found.      It  was challenging to advance the cytology brush into the CBD and cells       for cytology were obtained by brushing in the lower third of the main       bile duct.      One 10 mm by 4 cm covered metal stent was placed 4 cm into the common       bile duct. Bile flowed through the stent. The stent was in good position.      The pancreatic duct was never injected or canulated during the entire       procedure. Impression:               - The upper third of the main bile duct was                            markedly dilated.                           - The patient has had a cholecystectomy.                           - A biliary sphincterotomy was performed.                           - The biliary tree was swept and nothing was found.                            - Cells for cytology obtained in the lower third of                            the main duct.                           - One covered metal stent was placed into the                            common bile duct. Moderate Sedation:      Patient did not receive moderate sedation for this procedure, but       instead received monitored anesthesia care. Recommendation:           - Clear liquid diet.                           - Oncology consult.                           - OK to resume heparin now. Procedure Code(s):        --- Professional ---                           [phone], Endoscopic retrograde                            cholangiopancreatography (ERCP); with placement of                            endoscopic stent into biliary or pancreatic duct,  including pre- and post-dilation and guide wire                            passage, when performed, including sphincterotomy,                            when performed, each stent                           [phone], Endoscopic catheterization of the biliary                            ductal system, radiological supervision and                            interpretation Diagnosis Code(s):        --- Professional ---                           Z90.49, Acquired absence of other specified parts                            of digestive tract                           R17, Unspecified jaundice                           C25.0, Malignant neoplasm of head of pancreas                           K83.8, Other specified diseases of biliary tract CPT copyright 2019 American Medical Association. All rights reserved. The codes documented in this report are preliminary and upon coder review may  be revised to meet current compliance requirements. Ronnette Juniper, MD 06/01/2019 1:37:08 PM This report has been signed electronically. Arta Silence, MD Number of Addenda: 0

## 2019-06-02 DIAGNOSIS — K831 Obstruction of bile duct: Secondary | ICD-10-CM

## 2019-06-02 LAB — PREALBUMIN: Prealbumin: 7.5 mg/dL — ABNORMAL LOW (ref 18–38)

## 2019-06-02 LAB — CBC WITH DIFFERENTIAL/PLATELET
Abs Immature Granulocytes: 0.05 10*3/uL (ref 0.00–0.07)
Basophils Absolute: 0 10*3/uL (ref 0.0–0.1)
Basophils Relative: 0 %
Eosinophils Absolute: 0.1 10*3/uL (ref 0.0–0.5)
Eosinophils Relative: 1 %
HCT: 29 % — ABNORMAL LOW (ref 36.0–46.0)
Hemoglobin: 9.1 g/dL — ABNORMAL LOW (ref 12.0–15.0)
Immature Granulocytes: 1 %
Lymphocytes Relative: 9 %
Lymphs Abs: 0.6 10*3/uL — ABNORMAL LOW (ref 0.7–4.0)
MCH: 31 pg (ref 26.0–34.0)
MCHC: 31.4 g/dL (ref 30.0–36.0)
MCV: 98.6 fL (ref 80.0–100.0)
Monocytes Absolute: 0.6 10*3/uL (ref 0.1–1.0)
Monocytes Relative: 8 %
Neutro Abs: 6 10*3/uL (ref 1.7–7.7)
Neutrophils Relative %: 81 %
Platelets: 343 10*3/uL (ref 150–400)
RBC: 2.94 MIL/uL — ABNORMAL LOW (ref 3.87–5.11)
RDW: 16.5 % — ABNORMAL HIGH (ref 11.5–15.5)
WBC: 7.3 10*3/uL (ref 4.0–10.5)
nRBC: 0 % (ref 0.0–0.2)

## 2019-06-02 LAB — COMPREHENSIVE METABOLIC PANEL
ALT: 276 U/L — ABNORMAL HIGH (ref 0–44)
AST: 184 U/L — ABNORMAL HIGH (ref 15–41)
Albumin: 2.2 g/dL — ABNORMAL LOW (ref 3.5–5.0)
Alkaline Phosphatase: 488 U/L — ABNORMAL HIGH (ref 38–126)
Anion gap: 9 (ref 5–15)
BUN: 12 mg/dL (ref 8–23)
CO2: 25 mmol/L (ref 22–32)
Calcium: 8.1 mg/dL — ABNORMAL LOW (ref 8.9–10.3)
Chloride: 104 mmol/L (ref 98–111)
Creatinine, Ser: 0.55 mg/dL (ref 0.44–1.00)
GFR calc Af Amer: >60 mL/min (ref >60)
GFR calc non Af Amer: >60 mL/min (ref >60)
Glucose, Bld: 178 mg/dL — ABNORMAL HIGH (ref 70–99)
Potassium: 3.4 mmol/L — ABNORMAL LOW (ref 3.5–5.1)
Sodium: 138 mmol/L (ref 135–145)
Total Bilirubin: 1.5 mg/dL — ABNORMAL HIGH (ref 0.3–1.2)
Total Protein: 4.6 g/dL — ABNORMAL LOW (ref 6.5–8.1)

## 2019-06-02 LAB — CYTOLOGY - NON PAP

## 2019-06-02 LAB — CBC
HCT: 30.8 % — ABNORMAL LOW (ref 36.0–46.0)
Hemoglobin: 9.8 g/dL — ABNORMAL LOW (ref 12.0–15.0)
MCH: 31.5 pg (ref 26.0–34.0)
MCHC: 31.8 g/dL (ref 30.0–36.0)
MCV: 99 fL (ref 80.0–100.0)
Platelets: 352 10*3/uL (ref 150–400)
RBC: 3.11 MIL/uL — ABNORMAL LOW (ref 3.87–5.11)
RDW: 16.4 % — ABNORMAL HIGH (ref 11.5–15.5)
WBC: 8.2 10*3/uL (ref 4.0–10.5)
nRBC: 0 % (ref 0.0–0.2)

## 2019-06-02 LAB — HEPARIN LEVEL (UNFRACTIONATED): Heparin Unfractionated: 0.34 IU/mL (ref 0.30–0.70)

## 2019-06-02 LAB — GLUCOSE, CAPILLARY
Glucose-Capillary: 126 mg/dL — ABNORMAL HIGH (ref 70–99)
Glucose-Capillary: 113 mg/dL — ABNORMAL HIGH (ref 70–99)
Glucose-Capillary: 116 mg/dL — ABNORMAL HIGH (ref 70–99)
Glucose-Capillary: 148 mg/dL — ABNORMAL HIGH (ref 70–99)
Glucose-Capillary: 151 mg/dL — ABNORMAL HIGH (ref 70–99)
Glucose-Capillary: 154 mg/dL — ABNORMAL HIGH (ref 70–99)

## 2019-06-02 LAB — MAGNESIUM: Magnesium: 1.7 mg/dL (ref 1.7–2.4)

## 2019-06-02 MED ORDER — PANTOPRAZOLE SODIUM 40 MG PO TBEC
40.0000 mg | DELAYED_RELEASE_TABLET | Freq: Two times a day (BID) | ORAL | Status: DC
  Administered 2019-06-02 – 2019-06-05 (×7): 40 mg via ORAL
  Filled 2019-06-02 (×9): qty 1

## 2019-06-02 MED ORDER — DOCUSATE SODIUM 100 MG PO CAPS
100.0000 mg | ORAL_CAPSULE | Freq: Every day | ORAL | Status: DC
  Administered 2019-06-02 – 2019-06-03 (×2): 100 mg via ORAL
  Filled 2019-06-02 (×2): qty 1

## 2019-06-02 MED ORDER — POLYETHYLENE GLYCOL 3350 17 G PO PACK
17.0000 g | PACK | Freq: Every day | ORAL | Status: DC
  Administered 2019-06-03: 17 g via ORAL
  Filled 2019-06-02: qty 1

## 2019-06-02 MED ORDER — BOOST / RESOURCE BREEZE PO LIQD CUSTOM
1.0000 | Freq: Three times a day (TID) | ORAL | Status: DC
  Administered 2019-06-02: 1 via ORAL

## 2019-06-02 MED ORDER — APIXABAN 5 MG PO TABS
5.0000 mg | ORAL_TABLET | Freq: Two times a day (BID) | ORAL | Status: DC
  Administered 2019-06-02 – 2019-06-03 (×2): 5 mg via ORAL
  Filled 2019-06-02 (×2): qty 1

## 2019-06-02 NOTE — Progress Notes (Signed)
[place] for Heparin Indication: bilateral DVT (Eliquis on hold)  Allergies  Allergen Reactions  . Influenza Vaccines Other (See Comments)    PT STATES SHE GETS THE FLU FROM THE VACCINE    Patient Measurements: Height: 5\' 2"  (157.5 cm) Weight: 81.2 kg (179 lb) IBW/kg (Calculated) : 50.1 Heparin Dosing Weight: 68.2 kg  Vital Signs: Temp: 97.7 F (36.5 C) (04/07 0110) Temp Source: Oral (04/07 0110) BP: 123/60 (04/07 0110) Pulse Rate: 68 (04/07 0110)  Labs: Recent Labs    05/30/19 [id number] 05/30/19 [id number] 05/30/19 2300 05/31/19 0411 05/31/19 0411 05/31/19 1833 06/01/19 0417 06/02/19 0148 06/02/19 0154  HGB 9.7*   < >  --  8.9*   < >  --  9.2*  --  9.8*  HCT 30.6*   < >  --  28.5*  --   --  28.8*  --  30.8*  PLT 291   < >  --  272  --   --  278  --  352  HEPARINUNFRC  --   --  1.10*  --   --  0.56  --  0.34  --   CREATININE 0.47  --   --  0.41*  --   --  0.40*  --   --    < > = values in this interval not displayed.    Estimated Creatinine Clearance: 59 mL/min (A) (by C-G formula based on SCr of 0.4 mg/dL (L)).  Assessment: 79 yoF admitted for elevated liver enzymes. Pt was recently admitted to [facility] 3/12-3/22 for bowel obstruction and found to have bilateral DVT. Pt was sent home on Eliquis starter pack; however, on 3/30 outpt MD Dr. Laurann Montana instructed pt to stop the medication, so pt has not been on anticoagulation since then. During this admission, a repeat Doppler was done showing age-indeterminate DVT in left leg. Pharmacy consulted to dose heparin.   Heparin level therapeutic (0.34) on gtt at 900 units/hr.    Goal of Therapy:  Heparin level 0.3-0.7 units/ml Monitor platelets by anticoagulation protocol: Yes   Plan:  Continue heparin drip at 900 units/hr F/u daily heparin level F/u for restart of Eliquis  Thank you for allowing pharmacy to be part of this patients care team.  Sherlon Handing, PharmD, BCPS Please see  amion for complete clinical pharmacist phone list 06/02/2019 3:07 AM

## 2019-06-02 NOTE — Progress Notes (Signed)
[place] for   Transition back to [place]  Indication: bilateral DVT (Eliquis on hold)  Allergies  Allergen Reactions  . Influenza Vaccines Other (See Comments)    PT STATES SHE GETS THE FLU FROM THE VACCINE    Patient Measurements: Height: 5\' 2"  (157.5 cm) Weight: 81.2 kg (179 lb) IBW/kg (Calculated) : 50.1 Heparin Dosing Weight: 68.2 kg  Vital Signs: Temp: 97.5 F (36.4 C) (04/07 0444) Temp Source: Oral (04/07 0444) BP: 101/49 (04/07 0444) Pulse Rate: 65 (04/07 0444)  Labs: Recent Labs    05/30/19 2300 05/31/19 0411 05/31/19 0411 05/31/19 1833 06/01/19 0417 06/01/19 0417 06/02/19 0148 06/02/19 0154 06/02/19 1125  HGB  --  8.9*   < >  --  9.2*   < >  --  9.8* 9.1*  HCT  --  28.5*   < >  --  28.8*  --   --  30.8* 29.0*  PLT  --  272   < >  --  278  --   --  352 343  HEPARINUNFRC 1.10*  --   --  0.56  --   --  0.34  --   --   CREATININE  --  0.41*  --   --  0.40*  --   --   --  0.55   < > = values in this interval not displayed.    Estimated Creatinine Clearance: 59 mL/min (by C-G formula based on SCr of 0.55 mg/dL).  Assessment: 60 yoF admitted for elevated liver enzymes. Pt was recently admitted to [facility] 3/12-3/22 for bowel obstruction and found to have bilateral DVT. Pt was sent home on Eliquis starter pack; however, on 3/30 outpt MD Dr. Laurann Montana instructed pt to stop the medication, so pt has not been on anticoagulation since then. During this admission, a repeat Doppler was done showing age-indeterminate DVT in left leg. Pharmacy consulted to dose heparin.   Heparin level was  therapeutic (0.34) on gtt at 900 units/hr.  Now  Pharmacy consulted to restart Eliquis.   Goal of Therapy:  Monitor platelets by anticoagulation protocol: Yes   Plan:   Stop IV heparin drip at 16:00 today and at same time give dose of Eliquis 5 mg bid.  Monitor for signs and symptoms of bleeding.    Thank you for allowing pharmacy to be  part of this patients care team.  Nicole Cella, RPh Please see amion for complete clinical pharmacist phone list 06/02/2019 2:26 PM

## 2019-06-02 NOTE — Progress Notes (Signed)
Subjective: Patient was seen and examined at bedside. She has not had a bowel movement and is passing small amount of flatulence. Denies abdominal pain, however feels nauseous on clear liquids along with complains of early satiety and abdominal fullness.  Objective: Vital signs in last 24 hours: Temp:  [97.5 F (36.4 C)-98.4 F (36.9 C)] 97.5 F (36.4 C) (04/07 0444) Pulse Rate:  [65-77] 65 (04/07 0444) Resp:  [16-20] 18 (04/07 0444) BP: (101-167)/(49-70) 101/49 (04/07 0444) SpO2:  [93 %-96 %] 96 % (04/07 0444) Weight change:  Last BM Date: 05/29/19  PE: Overweight, mild pallor GENERAL: Not in distress ABDOMEN: Soft, nondistended, nontender EXTREMITIES: No deformity  Lab Results: Results for orders placed or performed during the hospital encounter of 05/29/19 (from the past 48 hour(s))  Glucose, capillary     Status: None   Collection Time: 05/31/19 12:29 PM  Result Value Ref Range   Glucose-Capillary 98 70 - 99 mg/dL    Comment: Glucose reference range applies only to samples taken after fasting for at least 8 hours.  Glucose, capillary     Status: Abnormal   Collection Time: 05/31/19  4:51 PM  Result Value Ref Range   Glucose-Capillary 135 (H) 70 - 99 mg/dL    Comment: Glucose reference range applies only to samples taken after fasting for at least 8 hours.  Heparin level (unfractionated)     Status: None   Collection Time: 05/31/19  6:33 PM  Result Value Ref Range   Heparin Unfractionated 0.56 0.30 - 0.70 IU/mL    Comment: (NOTE) If heparin results are below expected values, and patient dosage has  been confirmed, suggest follow up testing of antithrombin III levels. Performed at [facility], [place] [address], [place], [place] [address]   Glucose, capillary     Status: Abnormal   Collection Time: 05/31/19  8:26 PM  Result Value Ref Range   Glucose-Capillary 134 (H) 70 - 99 mg/dL    Comment: Glucose reference range applies only to samples taken after fasting  for at least 8 hours.  Cytology - Non PAP;     Status: None   Collection Time: 06/01/19 12:00 AM  Result Value Ref Range   CYTOLOGY - NON GYN      CYTOLOGY - NON PAP CASE: [id number] PATIENT: Tracy Odonnell Non-Gynecological Cytology Report     Clinical History: None provided Specimen Submitted:  A. COMMON BILE DUCT, DISTAL, BRUSHING:   FINAL MICROSCOPIC DIAGNOSIS: - Malignant cells consistent with adenocarcinoma  SPECIMEN ADEQUACY: Satisfactory for evaluation  GROSS: Received is/are 45cc's of clear colorless fluid with a brush (TS:MW:mw) Smears: 0 Concentration Method (ThinPrep): 1 Cell Block: Attempted, not obtained Additional Studies: N/A     Final Diagnosis performed by Gillie Manners, MD.   Electronically signed 06/02/2019 Technical and / or Professional components performed at [facility]. [facility], [place] [address], [place], [place] [address].  Immunohistochemistry Technical component (if applicable) was performed at [facility]. [address] [place], [place], [place] [address].   IMMUNOHISTOCHEMISTRY DISCLAIMER (if applicable): Some of these immunohistochemic al stains may have been developed and the performance characteristics determine by [facility]. Some may not have been cleared or approved by the U.S. Food and Drug Administration. The FDA has determined that such clearance or approval is not necessary. This test is used for clinical purposes. It should not be regarded as investigational or for research. This laboratory is certified under the [place] (  CLIA-88) as qualified to perform high complexity clinical laboratory testing.  The controls stained appropriately.   Glucose, capillary     Status: None   Collection Time: 06/01/19 12:24 AM  Result Value Ref Range   Glucose-Capillary 93 70 - 99 mg/dL    Comment: Glucose reference range applies only to samples taken  after fasting for at least 8 hours.   Comment 1 Notify RN   CBC     Status: Abnormal   Collection Time: 06/01/19  4:17 AM  Result Value Ref Range   WBC 5.9 4.0 - 10.5 K/uL   RBC 2.94 (L) 3.87 - 5.11 MIL/uL   Hemoglobin 9.2 (L) 12.0 - 15.0 g/dL   HCT 28.8 (L) 36.0 - 46.0 %   MCV 98.0 80.0 - 100.0 fL   MCH 31.3 26.0 - 34.0 pg   MCHC 31.9 30.0 - 36.0 g/dL   RDW 16.3 (H) 11.5 - 15.5 %   Platelets 278 150 - 400 K/uL   nRBC 0.0 0.0 - 0.2 %    Comment: Performed at [facility], [place] [address], [place], [place] [address]  Comprehensive metabolic panel     Status: Abnormal   Collection Time: 06/01/19  4:17 AM  Result Value Ref Range   Sodium 139 135 - 145 mmol/L   Potassium 3.1 (L) 3.5 - 5.1 mmol/L   Chloride 103 98 - 111 mmol/L   CO2 25 22 - 32 mmol/L   Glucose, Bld 89 70 - 99 mg/dL    Comment: Glucose reference range applies only to samples taken after fasting for at least 8 hours.   BUN 7 (L) 8 - 23 mg/dL   Creatinine, Ser 0.40 (L) 0.44 - 1.00 mg/dL   Calcium 8.5 (L) 8.9 - 10.3 mg/dL   Total Protein 4.9 (L) 6.5 - 8.1 g/dL   Albumin 2.2 (L) 3.5 - 5.0 g/dL   AST 162 (H) 15 - 41 U/L   ALT 249 (H) 0 - 44 U/L   Alkaline Phosphatase 417 (H) 38 - 126 U/L   Total Bilirubin 1.8 (H) 0.3 - 1.2 mg/dL   GFR calc non Af Amer >60 >60 mL/min   GFR calc Af Amer >60 >60 mL/min   Anion gap 11 5 - 15    Comment: Performed at [facility], [place] [address], [place], [place] [address]  Glucose, capillary     Status: None   Collection Time: 06/01/19  4:31 AM  Result Value Ref Range   Glucose-Capillary 78 70 - 99 mg/dL    Comment: Glucose reference range applies only to samples taken after fasting for at least 8 hours.  Glucose, capillary     Status: None   Collection Time: 06/01/19  7:38 AM  Result Value Ref Range   Glucose-Capillary 81 70 - 99 mg/dL    Comment: Glucose reference range applies only to samples taken after fasting for at least 8 hours.  Glucose, capillary     Status:  Abnormal   Collection Time: 06/01/19 11:15 AM  Result Value Ref Range   Glucose-Capillary 68 (L) 70 - 99 mg/dL    Comment: Glucose reference range applies only to samples taken after fasting for at least 8 hours.  Glucose, capillary     Status: Abnormal   Collection Time: 06/01/19 11:38 AM  Result Value Ref Range   Glucose-Capillary 115 (H) 70 - 99 mg/dL    Comment: Glucose reference range applies only to samples taken after fasting for  at least 8 hours.  Cytology - Non PAP;     Status: None   Collection Time: 06/01/19 12:25 PM  Result Value Ref Range   CYTOLOGY - NON GYN      CYTOLOGY - NON PAP CASE: [id number] PATIENT: Tracy Odonnell Non-Gynecological Cytology Report     Clinical History: Obstructive jaundice    FINAL MICROSCOPIC DIAGNOSIS: A. PANCREAS, PANCREATIC HEAD LESION, FINE NEEDLE ASPIRATION: - Malignant cells consistent with adenocarcinoma  SPECIMEN ADEQUACY: A. Satisfactory for Evaluation  IMMEDIATE EVALUATION: Malignant cells consistent with adenocarcinoma.  Results reported to Dr. Paulita Fujita on 06/01/2019.  [facility])  GROSS: Received is/are (A) (1) 2 Slides (1 Quick Stain).  (2) (1) 2 Slides (1 Quick Stain).  (3) (1) 2 Slides (1 Quick Stain).  Also received are 5 cc's of peach saline and 30 cc's of clear colorless cytolyt from 2 passes. (TS:MW:mw) Smears: 6 Concentration Method (ThinPrep): 2 Cell Block: 1 Conventional Additional Studies: N/A   Final Diagnosis performed by Gillie Manners, MD.   Electronically signed 06/02/2019 Technical and / or Professional components performed at [employer]. [facility], [address] [place] [address] [place], [place] [address].  Immunohistochemistry Technical component (if applicable) was performed at [facility]. [address], [place], [place], [place] [address].   IMMUNOHISTOCHEMISTRY DISCLAIMER (if applicable): Some of these immunohistochemical stains may have been developed and the performance  characteristics determine by [facility]. Some may not have been cleared or approved by the U.S. Food and Drug Administration. The FDA has determined that such clearance or approval is not necessary. This test is used for clinical purposes. It should not be regarded as investigational or for research. This laboratory is certified under the [place] (CLIA-88) as qualified to perform high complexity clinical laboratory testing.  The controls stained appropriately.   Glucose, capillary     Status: Abnormal   Collection Time: 06/01/19  1:46 PM  Result Value Ref Range   Glucose-Capillary 126 (H) 70 - 99 mg/dL    Comment: Glucose reference range applies only to samples taken after fasting for at least 8 hours.  Glucose, capillary     Status: Abnormal   Collection Time: 06/01/19  2:35 PM  Result Value Ref Range   Glucose-Capillary 108 (H) 70 - 99 mg/dL    Comment: Glucose reference range applies only to samples taken after fasting for at least 8 hours.  Glucose, capillary     Status: Abnormal   Collection Time: 06/01/19  5:37 PM  Result Value Ref Range   Glucose-Capillary 127 (H) 70 - 99 mg/dL    Comment: Glucose reference range applies only to samples taken after fasting for at least 8 hours.  Glucose, capillary     Status: Abnormal   Collection Time: 06/01/19  8:37 PM  Result Value Ref Range   Glucose-Capillary 143 (H) 70 - 99 mg/dL    Comment: Glucose reference range applies only to samples taken after fasting for at least 8 hours.  Heparin level (unfractionated)     Status: None   Collection Time: 06/02/19  1:48 AM  Result Value Ref Range   Heparin Unfractionated 0.34 0.30 - 0.70 IU/mL    Comment: (NOTE) If heparin results are below expected values, and patient dosage has  been confirmed, suggest follow up testing of antithrombin III levels. Performed at [facility], [place] [address], [place], [place] [address]   CBC      Status: Abnormal  Collection Time: 06/02/19  1:54 AM  Result Value Ref Range   WBC 8.2 4.0 - 10.5 K/uL   RBC 3.11 (L) 3.87 - 5.11 MIL/uL   Hemoglobin 9.8 (L) 12.0 - 15.0 g/dL   HCT 30.8 (L) 36.0 - 46.0 %   MCV 99.0 80.0 - 100.0 fL   MCH 31.5 26.0 - 34.0 pg   MCHC 31.8 30.0 - 36.0 g/dL   RDW 16.4 (H) 11.5 - 15.5 %   Platelets 352 150 - 400 K/uL   nRBC 0.0 0.0 - 0.2 %    Comment: Performed at [place] [address], [place], [place] [address]  Glucose, capillary     Status: Abnormal   Collection Time: 06/02/19  4:41 AM  Result Value Ref Range   Glucose-Capillary 113 (H) 70 - 99 mg/dL    Comment: Glucose reference range applies only to samples taken after fasting for at least 8 hours.  Glucose, capillary     Status: Abnormal   Collection Time: 06/02/19  8:09 AM  Result Value Ref Range   Glucose-Capillary 116 (H) 70 - 99 mg/dL    Comment: Glucose reference range applies only to samples taken after fasting for at least 8 hours.    Studies/Results: DG Abd 1 View  Result Date: 05/31/2019 CLINICAL DATA:  Recent bowel obstruction.  Nausea. EXAM: ABDOMEN - 1 VIEW COMPARISON:  May 17, 2019 FINDINGS: There is moderate stool in the colon. There is no bowel dilatation or air-fluid level to suggest bowel obstruction. No free air. There are surgical clips in the right upper quadrant. There is degenerative change in the lumbar spine. IMPRESSION: No bowel obstruction or free air.  Moderate stool in colon. Electronically Signed   By: Lowella Grip III M.D.   On: 05/31/2019 12:48   DG ERCP BILIARY & PANCREATIC DUCTS  Result Date: 06/01/2019 CLINICAL DATA:  Biliary dilatation to the pancreatic head with associated pancreatic duct dilatation. Concern for pancreatic head mass. EXAM: ERCP with FNA biopsy and CBD stent TECHNIQUE: Multiple spot images obtained with the fluoroscopic device and submitted for interpretation post-procedure. FLUOROSCOPY TIME:  Fluoroscopy Time:  5 minutes 9  seconds Radiation Exposure Index (if provided by the fluoroscopic device): 101 Number of Acquired Spot Images: 7 COMPARISON:  05/29/2019, 05/30/2019 FINDINGS: Spot fluoroscopic views during the ERCP procedure demonstrate previous cholecystectomy. Marked biliary dilatation. CBD expandable stent inserted. Please refer to the ERCP procedure note for further details of the intervention. IMPRESSION: Biliary dilatation correlates with the CT and MRI. Endoscopic distal CBD wall flex stent inserted. These images were submitted for radiologic interpretation only. Please see the procedural report for the amount of contrast and the fluoroscopy time utilized. Electronically Signed   By: Jerilynn Mages.  Shick M.D.   On: 06/01/2019 13:42    Medications: I have reviewed the patient's current medications.  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 PPI twice daily. Recommend nutritional evaluation.  Ronnette Juniper, MD 06/02/2019, 11:22 AM

## 2019-06-02 NOTE — Progress Notes (Signed)
Pharmacy Antibiotic Note  Tracy Odonnell is a 79 y.o. female admitted on 05/29/2019 with possible intra-abdominal infection.  Pharmacy  consulted on 4/3 PM for zosyn dosing.   Day #4 antbiotics Afebrile, WBC down to wnl.  Blood cultures no growth to date.  Plan: Continue Zosyn 3.375g IV every 8 hours (extended infusion) Monitor renal function, cultures and LOT  Height: 5\' 2"  (157.5 cm) Weight: 81.2 kg (179 lb) IBW/kg (Calculated) : 50.1  Temp (24hrs), Avg:98.1 F (36.7 C), Min:97.5 F (36.4 C), Max:99 F (37.2 C)  Recent Labs  Lab 05/29/19 1238 05/29/19 1823 05/30/19 0939 05/31/19 0411 06/01/19 0417 06/02/19 0154  WBC 10.6*  --  9.6 6.8 5.9 8.2  CREATININE 0.65  --  0.47 0.41* 0.40*  --   LATICACIDVEN  --  1.1  --   --   --   --     Estimated Creatinine Clearance: 59 mL/min (A) (by C-G formula based on SCr of 0.4 mg/dL (L)).    Allergies  Allergen Reactions  . Influenza Vaccines Other (See Comments)    PT STATES SHE GETS THE FLU FROM THE VACCINE    Antibiotics:  Zosyn 4/3 PM>>  Microbiology: 4/3 BCx : [id number]   Nicole Cella, RPh Clinical Pharmacist Please check AMION for all [place] phone numbers After 10:00 PM, call [place] [phone] 06/02/2019 9:08 AM

## 2019-06-02 NOTE — TOC Initial Note (Addendum)
Transition of Care [facility]) - Initial/Assessment Note    Patient Details  Name: Tracy Odonnell MRN: [id number] Date of Birth: October 31, 1940  Transition of [facility]) CM/SW Contact:    Marilu Favre, RN Phone Number: 06/02/2019, 11:00 AM  Clinical Narrative:                 Spoke to patient and two visitors (niece Shauna Hugh and cousin Quentin Mulling) at bedside. Discussed PT recommendations HHPT and 24 hour supervision. Visitors were surprised , they stated they were present during PT Eval and not told recommendations. Quentin Mulling is patient's cousin and she lives 5 to 10 minutes from patient. She can provide 24 hour supervision short term ( 2 weeks) . Provided Medicare.gov list of home health agencies. For patient and Quentin Mulling and Diane and patient  to review. Will follow up later.   Patient has walker and 3 in1 at home already   They have chosen [facility]. Tommi Rumps with [facility] has accepted referral. Will need home health PT orders   Expected Discharge Plan: [place] Barriers to Discharge: Continued Medical Work up   Patient Goals and CMS Choice Patient states their goals for this hospitalization and ongoing recovery are:: to return to home CMS Medicare.gov Compare Post Acute Care list provided to:: Patient Choice offered to / list presented to : Patient  Expected Discharge Plan and Services Expected Discharge Plan: [place]   Discharge Planning Services: CM Consult   Living arrangements for the past 2 months: Single Family Home                 DME Arranged: N/A         HH Arranged: PT          Prior Living Arrangements/Services Living arrangements for the past 2 months: Single Family Home Lives with:: Self Patient language and need for interpreter reviewed:: Yes Do you feel safe going back to the place where you live?: Yes      Need for Family Participation in Patient Care: Yes (Comment) Care giver support system in place?: Yes  (comment) Current home services: DME Criminal Activity/Legal Involvement Pertinent to Current Situation/Hospitalization: No - Comment as needed  Activities of Daily Living      Permission Sought/Granted   Permission granted to share information with : Yes, Verbal Permission Granted  Share Information with NAME: Quentin Mulling cousin           Emotional Assessment Appearance:: Appears stated age Attitude/Demeanor/Rapport: Engaged Affect (typically observed): Accepting Orientation: : Oriented to Self, Oriented to Place, Oriented to  Time, Oriented to Situation Alcohol / Substance Use: Not Applicable Psych Involvement: No (comment)  Admission diagnosis:  Elevated liver enzymes [R74.8] Patient Active Problem List   Diagnosis Date Noted  . Seizures ([place])   . DM (diabetes mellitus) ([place])   . Elevated liver enzymes   . Elevated blood-pressure reading without diagnosis of hypertension   . Dilated intrahepatic bile duct    PCP:  Lavone Orn, MD Pharmacy:   [id number] [place], [place] - [address] AT [place] [address] Phone: [phone] Fax: [phone]     Social Determinants of Health (SDOH) Interventions    Readmission Risk Interventions No flowsheet data found.

## 2019-06-02 NOTE — Discharge Instructions (Signed)

## 2019-06-02 NOTE — Progress Notes (Signed)
Patient ID: Tracy Odonnell, female   DOB: 20-Sep-1940, 79 y.o.   MRN: [id number]  PROGRESS NOTE    Tracy Odonnell  [id number] DOB: Jul 05, 1940 DOA: 05/29/2019 PCP: Lavone Orn, MD   Brief Narrative:  79 year old female with history of diabetes mellitus type 2, seizure, uterine cancer status post hysterectomy, obesity presented with right upper quadrant pain and vomiting.  She was found to have obstructive jaundice with elevated LFTs.  GI was consulted and patient underwent ERCP and stent placement along with EUS on 06/01/2019.   Assessment & Plan:  Obstructive jaundice with elevated LFTs/new diagnosis of pancreatic head adenocarcinoma --CT abdomen pelvis showed intrahepatic bile duct dilation extending into the common hepatic duct -MRCP showed severe intra and extrahepatic biliary ductal dilatation to the pancreatic head, with severe dilatation of the pancreatic duct in the pancreatic neck and more distally.  Constellation of findings suspicious for pancreatic adenocarcinoma. -Status post ERCP and stent placement in CBD along with EUS on 06/01/2019.  FNAC consistent with adenocarcinoma.  Will consult oncology. -Oral intake still very poor.  Advance diet if tolerated.  Will request nutritional evaluation. -We will also request palliative care evaluation for goals of care discussion -DC antibiotics.  Diabetes mellitus type 2 -A1c 7.7.  Continue Lantus along with CBGs with SSI.  Hypertension  -Monitor blood pressure  Seizure disorder -Continue Keppra  Bilateral lower extremity DVT --Lower extremity Doppler showed L age-indeterminate DVT of posterior tibial, peroneal veins.  Right no evidence of DVT. -Doppler on 05/16/2019 showed acute DVT of the right and left posterior tibial veins-patient was placed on Eliquis -Recently, patient discontinued Eliquis at the recommendation of her PCP, unsure as to why. -Currently on heparin.  Restart Eliquis  Mechanical fall -Patient with left eye  bruising -CT head unremarkable for acute finding -Continue fall precautions -PT recommended home health PT.  Chronic normocytic anemia -Hemoglobin stable.  Monitor.  Hypokalemia -Replace.  Repeat a.m. labs  DVT prophylaxis: Heparin Code Status: Full Family Communication: Spoke to patient and family member at bedside Disposition Plan: Home in 1 to 3 days once oral intake improves and once cleared by GI.  Consultants: GI  Procedures:  ERCP and stent placement in CBD on 06/01/2019 EUS on 06/01/2019  Antimicrobials: Zosyn   Subjective: Patient seen and examined at bedside.  Her oral intake is still very poor.  Still intermittently nauseous.  No overnight fever or chest pain or shortness of breath reported. Objective: Vitals:   06/01/19 1851 06/01/19 2039 06/02/19 0110 06/02/19 0444  BP: (!) 152/66 (!) 125/50 123/60 (!) 101/49  Pulse:  67 68 65  Resp:  16 16 18   Temp:  98.4 F (36.9 C) 97.7 F (36.5 C) (!) 97.5 F (36.4 C)  TempSrc:  Oral Oral Oral  SpO2:  96% 96% 96%  Weight:      Height:        Intake/Output Summary (Last 24 hours) at 06/02/2019 1337 Last data filed at 06/02/2019 1304 Gross per 24 hour  Intake 1238 ml  Output --  Net 1238 ml   Filed Weights   05/29/19 1224 06/01/19 1025  Weight: 81.2 kg 81.2 kg    Examination:  General exam: Appears calm and comfortable  Respiratory system: Bilateral decreased breath sounds at bases Cardiovascular system: S1 & S2 heard, Rate controlled Gastrointestinal system: Abdomen is nondistended, soft and nontender. Normal bowel sounds heard. Extremities: No cyanosis, clubbing, edema  Central nervous system: Alert and oriented. No focal neurological deficits. Moving extremities Skin: No  rashes, lesions or ulcers Psychiatry: Flat affect    Data Reviewed: I have personally reviewed following labs and imaging studies  CBC: Recent Labs  Lab 05/29/19 1238 05/29/19 1238 05/30/19 0939 05/31/19 0411 06/01/19 0417  06/02/19 0154 06/02/19 1125  WBC 10.6*   < > 9.6 6.8 5.9 8.2 7.3  NEUTROABS 9.7*  --   --   --   --   --  6.0  HGB 11.8*   < > 9.7* 8.9* 9.2* 9.8* 9.1*  HCT 36.6   < > 30.6* 28.5* 28.8* 30.8* 29.0*  MCV 95.1   < > 98.4 98.6 98.0 99.0 98.6  PLT 372   < > 291 272 278 352 343   < > = values in this interval not displayed.   Basic Metabolic Panel: Recent Labs  Lab 05/29/19 1238 05/29/19 1648 05/30/19 0939 05/31/19 0411 05/31/19 0656 06/01/19 0417 06/02/19 1125  NA 138  --  143 140  --  139 138  K 4.1  --  3.8 2.4*  --  3.1* 3.4*  CL 92*  --  103 104  --  103 104  CO2 29  --  30 27  --  25 25  GLUCOSE 320*  --  153* 116*  --  89 178*  BUN 16  --  13 9  --  7* 12  CREATININE 0.65  --  0.47 0.41*  --  0.40* 0.55  CALCIUM 9.7  --  8.7* 8.1*  --  8.5* 8.1*  MG  --  2.0  --   --  1.8  --  1.7   GFR: Estimated Creatinine Clearance: 59 mL/min (by C-G formula based on SCr of 0.55 mg/dL). Liver Function Tests: Recent Labs  Lab 05/29/19 1238 05/30/19 0939 05/31/19 0411 06/01/19 0417 06/02/19 1125  AST 393* 312* 228* 162* 184*  ALT 444* 351* 289* 249* 276*  ALKPHOS 782* 562* 443* 417* 488*  BILITOT 4.1* 2.7* 2.0* 1.8* 1.5*  PROT 6.9 5.1* 4.6* 4.9* 4.6*  ALBUMIN 3.3* 2.5* 2.2* 2.2* 2.2*   Recent Labs  Lab 05/29/19 1238  LIPASE 40   Recent Labs  Lab 05/29/19 1238  AMMONIA 36*   Coagulation Profile: Recent Labs  Lab 05/29/19 1238  INR 1.0   Cardiac Enzymes: No results for input(s): CKTOTAL, CKMB, CKMBINDEX, TROPONINI in the last 168 hours. BNP (last 3 results) No results for input(s): PROBNP in the last 8760 hours. HbA1C: No results for input(s): HGBA1C in the last 72 hours. CBG: Recent Labs  Lab 06/01/19 1737 06/01/19 2037 06/02/19 0441 06/02/19 0809 06/02/19 1159  GLUCAP 127* 143* 113* 116* 148*   Lipid Profile: No results for input(s): CHOL, HDL, LDLCALC, TRIG, CHOLHDL, LDLDIRECT in the last 72 hours. Thyroid Function Tests: No results for input(s):  TSH, T4TOTAL, FREET4, T3FREE, THYROIDAB in the last 72 hours. Anemia Panel: No results for input(s): VITAMINB12, FOLATE, FERRITIN, TIBC, IRON, RETICCTPCT in the last 72 hours. Sepsis Labs: Recent Labs  Lab 05/29/19 1823  LATICACIDVEN 1.1    Recent Results (from the past 240 hour(s))  SARS CORONAVIRUS 2 (TAT 6-24 HRS) Nasopharyngeal Nasopharyngeal Swab     Status: None   Collection Time: 05/29/19  4:39 PM   Specimen: Nasopharyngeal Swab  Result Value Ref Range Status   SARS Coronavirus 2 NEGATIVE NEGATIVE Final    Comment: (NOTE) SARS-CoV-2 target nucleic acids are NOT DETECTED. The SARS-CoV-2 RNA is generally detectable in upper and lower respiratory specimens during the acute phase of infection. Negative  results do not preclude SARS-CoV-2 infection, do not rule out co-infections with other pathogens, and should not be used as the sole basis for treatment or other patient management decisions. Negative results must be combined with clinical observations, patient history, and epidemiological information. The expected result is Negative. Fact Sheet for Patients: SugarRoll.be Fact Sheet for Healthcare Providers: https://www.woods-mathews.com/ This test is not yet approved or cleared by the [place] FDA and  has been authorized for detection and/or diagnosis of SARS-CoV-2 by FDA under an Emergency Use Authorization (EUA). This EUA will remain  in effect (meaning this test can be used) for the duration of the COVID-19 declaration under Section 56 4(b)(1) of the Act, 21 U.S.C. section 360bbb-3(b)(1), unless the authorization is terminated or revoked sooner. Performed at [facility], [place] [address] [place], [place] [address]   Culture, blood (routine x 2)     Status: None (Preliminary result)   Collection Time: 05/29/19  4:48 PM   Specimen: BLOOD  Result Value Ref Range Status   Specimen Description BLOOD RIGHT ARM  Final    Special Requests   Final    BOTTLES DRAWN AEROBIC AND ANAEROBIC Blood Culture adequate volume   Culture   Final    NO GROWTH 4 DAYS Performed at [facility], [place] [address], [place], [place] [address]    Report Status PENDING  Incomplete  Culture, blood (routine x 2)     Status: None (Preliminary result)   Collection Time: 05/29/19  6:23 PM   Specimen: BLOOD  Result Value Ref Range Status   Specimen Description BLOOD RIGHT ARM  Final   Special Requests   Final    BOTTLES DRAWN AEROBIC ONLY Blood Culture adequate volume   Culture   Final    NO GROWTH 4 DAYS Performed at [facility], [place] [address] [place], [place] [address]    Report Status PENDING  Incomplete         Radiology Studies: DG ERCP BILIARY & PANCREATIC DUCTS  Result Date: 06/01/2019 CLINICAL DATA:  Biliary dilatation to the pancreatic head with associated pancreatic duct dilatation. Concern for pancreatic head mass. EXAM: ERCP with FNA biopsy and CBD stent TECHNIQUE: Multiple spot images obtained with the fluoroscopic device and submitted for interpretation post-procedure. FLUOROSCOPY TIME:  Fluoroscopy Time:  5 minutes 9 seconds Radiation Exposure Index (if provided by the fluoroscopic device): 101 Number of Acquired Spot Images: 7 COMPARISON:  05/29/2019, 05/30/2019 FINDINGS: Spot fluoroscopic views during the ERCP procedure demonstrate previous cholecystectomy. Marked biliary dilatation. CBD expandable stent inserted. Please refer to the ERCP procedure note for further details of the intervention. IMPRESSION: Biliary dilatation correlates with the CT and MRI. Endoscopic distal CBD wall flex stent inserted. These images were submitted for radiologic interpretation only. Please see the procedural report for the amount of contrast and the fluoroscopy time utilized. Electronically Signed   By: Jerilynn Mages.  Shick M.D.   On: 06/01/2019 13:42        Scheduled Meds: . feeding supplement  1 Container Oral TID WC  .  insulin aspart  0-15 Units Subcutaneous Q4H  . insulin glargine  12 Units Subcutaneous QHS  . pantoprazole  40 mg Oral BID AC  . sodium chloride flush  10-40 mL Intracatheter Q12H   Continuous Infusions: . sodium chloride 100 mL/hr at 06/02/19 1004  . heparin 900 Units/hr (06/01/19 1546)  . levETIRAcetam 500 mg (06/02/19 0424)  . piperacillin-tazobactam (ZOSYN)  IV 3.375 g (06/02/19 0827)  Aline August, MD [facility] 06/02/2019, 1:37 PM

## 2019-06-03 ENCOUNTER — Encounter: Payer: Self-pay | Admitting: *Deleted

## 2019-06-03 LAB — CULTURE, BLOOD (ROUTINE X 2)
Culture: NO GROWTH
Culture: NO GROWTH
Special Requests: ADEQUATE
Special Requests: ADEQUATE

## 2019-06-03 LAB — COMPREHENSIVE METABOLIC PANEL
ALT: 209 U/L — ABNORMAL HIGH (ref 0–44)
AST: 98 U/L — ABNORMAL HIGH (ref 15–41)
Albumin: 2.1 g/dL — ABNORMAL LOW (ref 3.5–5.0)
Alkaline Phosphatase: 373 U/L — ABNORMAL HIGH (ref 38–126)
Anion gap: 7 (ref 5–15)
BUN: 8 mg/dL (ref 8–23)
CO2: 25 mmol/L (ref 22–32)
Calcium: 8.2 mg/dL — ABNORMAL LOW (ref 8.9–10.3)
Chloride: 105 mmol/L (ref 98–111)
Creatinine, Ser: 0.41 mg/dL — ABNORMAL LOW (ref 0.44–1.00)
GFR calc Af Amer: >60 mL/min (ref >60)
GFR calc non Af Amer: >60 mL/min (ref >60)
Glucose, Bld: 104 mg/dL — ABNORMAL HIGH (ref 70–99)
Potassium: 3.2 mmol/L — ABNORMAL LOW (ref 3.5–5.1)
Sodium: 137 mmol/L (ref 135–145)
Total Bilirubin: 1.3 mg/dL — ABNORMAL HIGH (ref 0.3–1.2)
Total Protein: 4.5 g/dL — ABNORMAL LOW (ref 6.5–8.1)

## 2019-06-03 LAB — CBC
HCT: 27.2 % — ABNORMAL LOW (ref 36.0–46.0)
Hemoglobin: 8.5 g/dL — ABNORMAL LOW (ref 12.0–15.0)
MCH: 30.8 pg (ref 26.0–34.0)
MCHC: 31.3 g/dL (ref 30.0–36.0)
MCV: 98.6 fL (ref 80.0–100.0)
Platelets: 281 10*3/uL (ref 150–400)
RBC: 2.76 MIL/uL — ABNORMAL LOW (ref 3.87–5.11)
RDW: 16.5 % — ABNORMAL HIGH (ref 11.5–15.5)
WBC: 7.6 10*3/uL (ref 4.0–10.5)
nRBC: 0 % (ref 0.0–0.2)

## 2019-06-03 LAB — GLUCOSE, CAPILLARY
Glucose-Capillary: 101 mg/dL — ABNORMAL HIGH (ref 70–99)
Glucose-Capillary: 139 mg/dL — ABNORMAL HIGH (ref 70–99)
Glucose-Capillary: 194 mg/dL — ABNORMAL HIGH (ref 70–99)
Glucose-Capillary: 220 mg/dL — ABNORMAL HIGH (ref 70–99)
Glucose-Capillary: 92 mg/dL (ref 70–99)
Glucose-Capillary: 97 mg/dL (ref 70–99)

## 2019-06-03 LAB — HEPARIN LEVEL (UNFRACTIONATED): Heparin Unfractionated: 1.06 IU/mL — ABNORMAL HIGH (ref 0.30–0.70)

## 2019-06-03 LAB — MAGNESIUM: Magnesium: 1.6 mg/dL — ABNORMAL LOW (ref 1.7–2.4)

## 2019-06-03 MED ORDER — SENNOSIDES-DOCUSATE SODIUM 8.6-50 MG PO TABS
1.0000 | ORAL_TABLET | Freq: Two times a day (BID) | ORAL | Status: DC
  Administered 2019-06-03 – 2019-06-04 (×2): 1 via ORAL
  Filled 2019-06-03 (×3): qty 1

## 2019-06-03 MED ORDER — POTASSIUM CHLORIDE CRYS ER 20 MEQ PO TBCR
40.0000 meq | EXTENDED_RELEASE_TABLET | ORAL | Status: AC
  Administered 2019-06-03 (×2): 40 meq via ORAL
  Filled 2019-06-03 (×3): qty 2

## 2019-06-03 MED ORDER — GLUCERNA SHAKE PO LIQD
237.0000 mL | Freq: Two times a day (BID) | ORAL | Status: DC
  Administered 2019-06-04 (×2): 237 mL via ORAL

## 2019-06-03 MED ORDER — ENSURE MAX PROTEIN PO LIQD
11.0000 [oz_av] | Freq: Two times a day (BID) | ORAL | Status: DC
  Administered 2019-06-04 – 2019-06-05 (×2): 11 [oz_av] via ORAL
  Filled 2019-06-03 (×5): qty 330

## 2019-06-03 MED ORDER — BISACODYL 10 MG RE SUPP
10.0000 mg | Freq: Once | RECTAL | Status: AC
  Administered 2019-06-03: 10 mg via RECTAL
  Filled 2019-06-03: qty 1

## 2019-06-03 MED ORDER — MAGNESIUM SULFATE 2 GM/50ML IV SOLN
2.0000 g | Freq: Once | INTRAVENOUS | Status: AC
  Administered 2019-06-03: 13:00:00 2 g via INTRAVENOUS
  Filled 2019-06-03: qty 50

## 2019-06-03 MED ORDER — ADULT MULTIVITAMIN W/MINERALS CH
1.0000 | ORAL_TABLET | Freq: Every day | ORAL | Status: DC
  Administered 2019-06-03 – 2019-06-05 (×3): 1 via ORAL
  Filled 2019-06-03 (×3): qty 1

## 2019-06-03 NOTE — TOC Benefit Eligibility Note (Signed)
Transition of Care [facility]) Benefit Eligibility Note    Patient Details  Name: Tracy Odonnell MRN: [id number] Date of Birth: 06-10-1940   Medication/Dose: ELIQUIS  5 MG BID  Covered?: Yes  Tier: 3 Drug  Prescription Coverage Preferred Pharmacy: [facility]  Spoke with Person/Company/Phone Number:: ALISIA   @  [employer] # [phone] OPT- MEMBER  Co-Pay: $47.00  Prior Approval: No  Deductible: Unmet       Memory Argue Phone Number: 06/03/2019, 11:01 AM

## 2019-06-03 NOTE — Progress Notes (Signed)
Initial Nutrition Assessment  DOCUMENTATION CODES:   Obesity unspecified  INTERVENTION:   -Ensure Max po BID, each supplement provides 150 kcal and 30 grams of protein.  -Glucerna Shake po BID, each supplement provides 220 kcal and 10 grams of protein -MVI with minerals daily  NUTRITION DIAGNOSIS:   Increased nutrient needs related to cancer and cancer related treatments as evidenced by estimated needs.  GOAL:   Patient will meet greater than or equal to 90% of their needs  MONITOR:   PO intake, Supplement acceptance, Diet advancement, Labs, Weight trends, Skin, I & O's  REASON FOR ASSESSMENT:   Consult Assessment of nutrition requirement/status  ASSESSMENT:   Tracy Odonnell is a 79 y.o. female with medical history significant of insulin-dependent diabetes mellitus, seizure, uterine cancer status post hysterectomy, obesity presents to emergency department due to right upper quadrant pain and vomiting since 2 days.  Pt admitted with elevated liver enzymes.   4/6- s/p upper EUS- revealed extensive changes of chronic pancreatitis; perihepatic ascites and extensive soft tissue swelling of antral and proximal duodenal mucosa; possible subtle infiltrative-type mass wasidentified in the pancreatic head, fine needle  aspiration performed 4/6- s/p ERCP- revealed upper third of the main bile duct was markedly dilated; biliary sphincterotomy was performed; biliary tree was swept and nothing was found;  cells for cytology obtained in the lower third of the main duct; one covered metal stent was placed into the  common bile duct.  Reviewed I/O's: +750 ml x 24 hours and +4.3 L since admission  Pt awaiting surgical consult to discuss further treatment options.   Spoke with pt, who was sitting in recliner chair at time of visit. Her cousin, [name], was also visiting. Pt explains that she has had decreased oral intake over the past 3-4 weeks, since being hospitalized at [facility]. Pt shares she had been experiencing nausea and vomiting and often can only tolerate bites and sips of liquids. Per pt, she has only been consuming small amount of clear liquids until today and feel encouraged that she consumed 50% of [place] yogurt and some grits this AM.   Pt reports that she is also tolerating Boost Glucose Control supplements that family has brought in for her, but also interested in other options. Per pt and [name], they are concerned about the carbohydrate content of full liquid tray as well as supplements (pt refused Boost Breeze because of this). RD discussed importance of both calories and protein to support healing and how acute illness/stress response can impact blood sugar levels. Also reviewed current treatment for DM and how hospitalized patients are treated aggressively for hyperglycemia (insulin and frequent monitoring). Despite this explanation, they still desire to be provided with high protein, low carbohydrate supplements. Discussed importance of good meal and supplements intake; pt very receptive to RD recommendations.   No weight loss per wt hx.   Medications reviewed and include 0.9% sodium chloride infusion @ 50 ml/r and IV keppra.   Lab Results  Component Value Date   HGBA1C 7.7 (H) 05/29/2019   PTA DM medications are 12 units insulin glargine daily.   Labs reviewed: CBGS: 92-154 (inpatient orders for glycemic control are 0-15 units insulin aspart every 4 hours and 12 units insulin glargine daily).   NUTRITION - FOCUSED PHYSICAL EXAM:    Most Recent Value  Orbital Region  No depletion  Upper Arm Region  No depletion  Thoracic and Lumbar Region  No depletion  Buccal Region  No depletion  Temple Region  No depletion  Clavicle Bone Region  No depletion  Clavicle and Acromion Bone Region  No depletion  Scapular Bone Region  No depletion  Dorsal Hand  No depletion  Patellar Region  No depletion  Anterior Thigh Region  No depletion  Posterior  Calf Region  No depletion  Edema (RD Assessment)  Moderate  Hair  Reviewed  Eyes  Reviewed  Mouth  Reviewed  Skin  Reviewed  Nails  Reviewed       Diet Order:   Diet Order            DIET SOFT Room service appropriate? Yes; Fluid consistency: Thin  Diet effective now              EDUCATION NEEDS:   Education needs have been addressed  Skin:  Skin Assessment: Reviewed RN Assessment  Last BM:  05/29/19  Height:   Ht Readings from Last 1 Encounters:  06/01/19 5\' 2"  (1.575 m)    Weight:   Wt Readings from Last 1 Encounters:  06/01/19 81.2 kg    Ideal Body Weight:  50 kg  BMI:  Body mass index is 32.74 kg/m.  Estimated Nutritional Needs:   Kcal:  1800-2000  Protein:  100-115 grams  Fluid:  > 1.8 L    Loistine Chance, RD, LDN, [place] Registered Dietitian II Certified Diabetes Care and Education Specialist Please refer to [facility] for RD and/or RD on-call/weekend/after hours pager

## 2019-06-03 NOTE — Progress Notes (Signed)
Patient ID: Tracy Odonnell, female   DOB: Jan 23, 1941, 79 y.o.   MRN: [id number]  PROGRESS NOTE    Tracy Odonnell  [id number] DOB: [birth year] DOA: 05/29/2019 PCP: Lavone Orn, MD   Brief Narrative:  79 year old female with history of diabetes mellitus type 2, seizure, uterine cancer status post hysterectomy, obesity presented with right upper quadrant pain and vomiting.  She was found to have obstructive jaundice with elevated LFTs.  GI was consulted and patient underwent ERCP and stent placement along with EUS on 06/01/2019.   Assessment & Plan:  Obstructive jaundice with elevated LFTs/new diagnosis of pancreatic head adenocarcinoma --CT abdomen pelvis showed intrahepatic bile duct dilation extending into the common hepatic duct -MRCP showed severe intra and extrahepatic biliary ductal dilatation to the pancreatic head, with severe dilatation of the pancreatic duct in the pancreatic neck and more distally.  Constellation of findings suspicious for pancreatic adenocarcinoma. -Status post ERCP and stent placement in CBD along with EUS on 06/01/2019.  FNAC consistent with adenocarcinoma.  Will consult oncology. -Oral intake still very poor.  Advance diet as tolerated.  Follow nutritional evaluation. -Antibiotics discontinued on 06/02/2019 -Palliative care evaluation pending -Oncology evaluation appreciated.  I have requested general surgery evaluation as well. -LFTs improving.  Monitor.  Diabetes mellitus type 2 -A1c 7.7.  Continue Lantus along with CBGs with SSI.  Hypertension  -Monitor blood pressure  Seizure disorder -Continue Keppra  Bilateral lower extremity DVT --Lower extremity Doppler showed L age-indeterminate DVT of posterior tibial, peroneal veins.  Right no evidence of DVT. -Doppler on 05/16/2019 showed acute DVT of the right and left posterior tibial veins-patient was placed on Eliquis -Recently, patient discontinued Eliquis at the recommendation of her PCP, unsure as to  why. -Eliquis has been restarted  Hypomagnesemia -Replace.  Repeat a.m. labs.  Mechanical fall -Patient with left eye bruising -CT head unremarkable for acute finding -Continue fall precautions -PT recommended home health PT.  Chronic normocytic anemia -Hemoglobin stable.  Monitor.  Hypokalemia -Replace.  Repeat a.m. labs  DVT prophylaxis: Heparin Code Status: Full Family Communication: Spoke to patient and family member at bedside Disposition Plan: Oriented still poor.  Will continue IV fluids.  Pending general surgery evaluation.  If not a surgical candidate, discharged home in 1 to 2 days if remains stable. Consultants: GI/general surgery/oncology/palliative care  Procedures:  ERCP and stent placement in CBD on 06/01/2019 EUS on 06/01/2019  Antimicrobials: Zosyn 05/29/2019-06/02/2019   Subjective: Patient seen and examined at bedside.  Her oral intake is still very poor.  Still complains of intermittent nausea.  No bowel movement since admission.  Complains of intermittent abdominal pain.  No fever, shortness of breath.  Objective: Vitals:   06/02/19 0444 06/02/19 1620 06/02/19 2109 06/03/19 0405  BP: (!) 101/49 (!) 137/51 (!) 144/53 (!) 140/52  Pulse: 65 69 72 74  Resp: 18 18 16 17   Temp: (!) 97.5 F (36.4 C) 98.7 F (37.1 C) 97.8 F (36.6 C) 98.3 F (36.8 C)  TempSrc: Oral Oral Oral Oral  SpO2: 96% 97% 95% 98%  Weight:      Height:        Intake/Output Summary (Last 24 hours) at 06/03/2019 1152 Last data filed at 06/02/2019 2015 Gross per 24 hour  Intake 310 ml  Output --  Net 310 ml   Filed Weights   05/29/19 1224 06/01/19 1025  Weight: 81.2 kg 81.2 kg    Examination:  General exam: Appears calm and comfortable.  No acute distress. Respiratory system:  Bilateral decreased breath sounds at bases with some scattered crackles Cardiovascular system: Rate controlled, S1-S2 heard Gastrointestinal system: Abdomen is slightly distended, soft and nontender.  Bowel  sounds are heard Extremities: No cyanosis, clubbing; trace lower extremity edema Central nervous system: Alert and oriented. No focal neurological deficits. Moving extremities Skin: No ulcers or lesions Psychiatry: Has flat affect.    Data Reviewed: I have personally reviewed following labs and imaging studies  CBC: Recent Labs  Lab 05/29/19 1238 05/30/19 0939 05/31/19 0411 06/01/19 0417 06/02/19 0154 06/02/19 1125 06/03/19 0434  WBC 10.6*   < > 6.8 5.9 8.2 7.3 7.6  NEUTROABS 9.7*  --   --   --   --  6.0  --   HGB 11.8*   < > 8.9* 9.2* 9.8* 9.1* 8.5*  HCT 36.6   < > 28.5* 28.8* 30.8* 29.0* 27.2*  MCV 95.1   < > 98.6 98.0 99.0 98.6 98.6  PLT 372   < > 272 278 352 343 281   < > = values in this interval not displayed.   Basic Metabolic Panel: Recent Labs  Lab 05/29/19 1238 05/29/19 1648 05/30/19 0939 05/31/19 0411 05/31/19 0656 06/01/19 0417 06/02/19 1125 06/03/19 0434  NA   < >  --  143 140  --  139 138 137  K   < >  --  3.8 2.4*  --  3.1* 3.4* 3.2*  CL   < >  --  103 104  --  103 104 105  CO2   < >  --  30 27  --  25 25 25   GLUCOSE   < >  --  153* 116*  --  89 178* 104*  BUN   < >  --  13 9  --  7* 12 8  CREATININE   < >  --  0.47 0.41*  --  0.40* 0.55 0.41*  CALCIUM   < >  --  8.7* 8.1*  --  8.5* 8.1* 8.2*  MG  --  2.0  --   --  1.8  --  1.7 1.6*   < > = values in this interval not displayed.   GFR: Estimated Creatinine Clearance: 59 mL/min (A) (by C-G formula based on SCr of 0.41 mg/dL (L)). Liver Function Tests: Recent Labs  Lab 05/30/19 0939 05/31/19 0411 06/01/19 0417 06/02/19 1125 06/03/19 0434  AST 312* 228* 162* 184* 98*  ALT 351* 289* 249* 276* 209*  ALKPHOS 562* 443* 417* 488* 373*  BILITOT 2.7* 2.0* 1.8* 1.5* 1.3*  PROT 5.1* 4.6* 4.9* 4.6* 4.5*  ALBUMIN 2.5* 2.2* 2.2* 2.2* 2.1*   Recent Labs  Lab 05/29/19 1238  LIPASE 40   Recent Labs  Lab 05/29/19 1238  AMMONIA 36*   Coagulation Profile: Recent Labs  Lab 05/29/19 1238  INR  1.0   Cardiac Enzymes: No results for input(s): CKTOTAL, CKMB, CKMBINDEX, TROPONINI in the last 168 hours. BNP (last 3 results) No results for input(s): PROBNP in the last 8760 hours. HbA1C: No results for input(s): HGBA1C in the last 72 hours. CBG: Recent Labs  Lab 06/02/19 1555 06/02/19 2107 06/02/19 2359 06/03/19 0400 06/03/19 0812  GLUCAP 151* 154* 97 92 101*   Lipid Profile: No results for input(s): CHOL, HDL, LDLCALC, TRIG, CHOLHDL, LDLDIRECT in the last 72 hours. Thyroid Function Tests: No results for input(s): TSH, T4TOTAL, FREET4, T3FREE, THYROIDAB in the last 72 hours. Anemia Panel: No results for input(s): VITAMINB12, FOLATE, FERRITIN, TIBC, IRON, RETICCTPCT  in the last 72 hours. Sepsis Labs: Recent Labs  Lab 05/29/19 1823  LATICACIDVEN 1.1    Recent Results (from the past 240 hour(s))  SARS CORONAVIRUS 2 (TAT 6-24 HRS) Nasopharyngeal Nasopharyngeal Swab     Status: None   Collection Time: 05/29/19  4:39 PM   Specimen: Nasopharyngeal Swab  Result Value Ref Range Status   SARS Coronavirus 2 NEGATIVE NEGATIVE Final    Comment: (NOTE) SARS-CoV-2 target nucleic acids are NOT DETECTED. The SARS-CoV-2 RNA is generally detectable in upper and lower respiratory specimens during the acute phase of infection. Negative results do not preclude SARS-CoV-2 infection, do not rule out co-infections with other pathogens, and should not be used as the sole basis for treatment or other patient management decisions. Negative results must be combined with clinical observations, patient history, and epidemiological information. The expected result is Negative. Fact Sheet for Patients: SugarRoll.be Fact Sheet for Healthcare Providers: https://www.woods-mathews.com/ This test is not yet approved or cleared by the [place] FDA and  has been authorized for detection and/or diagnosis of SARS-CoV-2 by FDA under an Emergency Use  Authorization (EUA). This EUA will remain  in effect (meaning this test can be used) for the duration of the COVID-19 declaration under Section 56 4(b)(1) of the Act, 21 U.S.C. section 360bbb-3(b)(1), unless the authorization is terminated or revoked sooner. Performed at [facility], [place] [address] [place], [place] [address]   Culture, blood (routine x 2)     Status: None   Collection Time: 05/29/19  4:48 PM   Specimen: BLOOD  Result Value Ref Range Status   Specimen Description BLOOD RIGHT ARM  Final   Special Requests   Final    BOTTLES DRAWN AEROBIC AND ANAEROBIC Blood Culture adequate volume   Culture   Final    NO GROWTH 5 DAYS Performed at [facility], [place] [address] [place], [place] [address]    Report Status 06/03/2019 FINAL  Final  Culture, blood (routine x 2)     Status: None   Collection Time: 05/29/19  6:23 PM   Specimen: BLOOD  Result Value Ref Range Status   Specimen Description BLOOD RIGHT ARM  Final   Special Requests   Final    BOTTLES DRAWN AEROBIC ONLY Blood Culture adequate volume   Culture   Final    NO GROWTH 5 DAYS Performed at [facility], [place] [address] [place], [place] [address]    Report Status 06/03/2019 FINAL  Final         Radiology Studies: DG ERCP BILIARY & PANCREATIC DUCTS  Result Date: 06/01/2019 CLINICAL DATA:  Biliary dilatation to the pancreatic head with associated pancreatic duct dilatation. Concern for pancreatic head mass. EXAM: ERCP with FNA biopsy and CBD stent TECHNIQUE: Multiple spot images obtained with the fluoroscopic device and submitted for interpretation post-procedure. FLUOROSCOPY TIME:  Fluoroscopy Time:  5 minutes 9 seconds Radiation Exposure Index (if provided by the fluoroscopic device): 101 Number of Acquired Spot Images: 7 COMPARISON:  05/29/2019, 05/30/2019 FINDINGS: Spot fluoroscopic views during the ERCP procedure demonstrate previous cholecystectomy. Marked biliary dilatation. CBD expandable  stent inserted. Please refer to the ERCP procedure note for further details of the intervention. IMPRESSION: Biliary dilatation correlates with the CT and MRI. Endoscopic distal CBD wall flex stent inserted. These images were submitted for radiologic interpretation only. Please see the procedural report for the amount of contrast and the fluoroscopy time utilized. Electronically Signed   By: Jerilynn Mages.  Shick  M.D.   On: 06/01/2019 13:42        Scheduled Meds: . apixaban  5 mg Oral BID  . docusate sodium  100 mg Oral Daily  . feeding supplement (GLUCERNA SHAKE)  237 mL Oral BID BM  . insulin aspart  0-15 Units Subcutaneous Q4H  . insulin glargine  12 Units Subcutaneous QHS  . multivitamin with minerals  1 tablet Oral Daily  . pantoprazole  40 mg Oral BID AC  . polyethylene glycol  17 g Oral Daily  . Ensure Max Protein  11 oz Oral BID  . sodium chloride flush  10-40 mL Intracatheter Q12H   Continuous Infusions: . sodium chloride 50 mL/hr at 06/03/19 0419  . levETIRAcetam 500 mg (06/03/19 0421)          Aline August, MD [facility] 06/03/2019, 11:52 AM

## 2019-06-03 NOTE — Progress Notes (Signed)
As previously noted, case was discussed with Dr. Barry Dienes our hepatobiliary specialist.  She has reviewed the case and given evidence for metastatic disease there is nothing to offer this patient from a surgical standpoint.  Diagnostic laparoscopy would not be mode of choice to biopsy these nodules for determination of metastatic disease.  Could consider IR evaluation for biopsy if able.  Further plans for treatment will be left up to oncology.    Henreitta Cea 1:15 PM 06/03/2019

## 2019-06-03 NOTE — Progress Notes (Signed)
General surgery is aware of this patient.  She appears to possibly have metastatic disease with omental and peritoneal nodules on her MRI.  She also has some evidence of this tumor being near several vascular structures.  The patient is likely not a surgical candidate, but will have Dr. Barry Dienes review her imaging for her opinion.  Will follow up once I hear back from her.  Tracy Odonnell 10:51 AM 06/03/2019

## 2019-06-03 NOTE — Progress Notes (Signed)
Subjective: Patient was seen and examined at bedside. She was sitting comfortably on a bedside chair, has been started on full liquid diet, without issues with nausea or vomiting, however she continues to feel full and has not had a bowel movement in several days.  She denies abdominal pain.  Objective: Vital signs in last 24 hours: Temp:  [97.8 F (36.6 C)-98.7 F (37.1 C)] 98.3 F (36.8 C) (04/08 0405) Pulse Rate:  [69-74] 74 (04/08 0405) Resp:  [16-18] 17 (04/08 0405) BP: (137-144)/(51-53) 140/52 (04/08 0405) SpO2:  [95 %-98 %] 98 % (04/08 0405) Weight change:  Last BM Date: 05/29/19  PE: Overweight, elderly, mild pallor, no icterus GENERAL: Not in distress ABDOMEN: Mild distention but otherwise soft, normoactive bowel sounds, nontender, no guarding or rigidity EXTREMITIES: Mild bilateral pitting edema  Lab Results: Results for orders placed or performed during the hospital encounter of 05/29/19 (from the past 48 hour(s))  Glucose, capillary     Status: Abnormal   Collection Time: 06/01/19  1:46 PM  Result Value Ref Range   Glucose-Capillary 126 (H) 70 - 99 mg/dL    Comment: Glucose reference range applies only to samples taken after fasting for at least 8 hours.  Glucose, capillary     Status: Abnormal   Collection Time: 06/01/19  2:35 PM  Result Value Ref Range   Glucose-Capillary 108 (H) 70 - 99 mg/dL    Comment: Glucose reference range applies only to samples taken after fasting for at least 8 hours.  Glucose, capillary     Status: Abnormal   Collection Time: 06/01/19  5:37 PM  Result Value Ref Range   Glucose-Capillary 127 (H) 70 - 99 mg/dL    Comment: Glucose reference range applies only to samples taken after fasting for at least 8 hours.  Glucose, capillary     Status: Abnormal   Collection Time: 06/01/19  8:37 PM  Result Value Ref Range   Glucose-Capillary 143 (H) 70 - 99 mg/dL    Comment: Glucose reference range applies only to samples taken after fasting for  at least 8 hours.  Heparin level (unfractionated)     Status: None   Collection Time: 06/02/19  1:48 AM  Result Value Ref Range   Heparin Unfractionated 0.34 0.30 - 0.70 IU/mL    Comment: (NOTE) If heparin results are below expected values, and patient dosage has  been confirmed, suggest follow up testing of antithrombin III levels. Performed at [facility], [place] [address] [place], [place] [address]   CBC     Status: Abnormal   Collection Time: 06/02/19  1:54 AM  Result Value Ref Range   WBC 8.2 4.0 - 10.5 K/uL   RBC 3.11 (L) 3.87 - 5.11 MIL/uL   Hemoglobin 9.8 (L) 12.0 - 15.0 g/dL   HCT 30.8 (L) 36.0 - 46.0 %   MCV 99.0 80.0 - 100.0 fL   MCH 31.5 26.0 - 34.0 pg   MCHC 31.8 30.0 - 36.0 g/dL   RDW 16.4 (H) 11.5 - 15.5 %   Platelets 352 150 - 400 K/uL   nRBC 0.0 0.0 - 0.2 %    Comment: Performed at [facility], [place] [address], [place], [place] [address]  Glucose, capillary     Status: Abnormal   Collection Time: 06/02/19  4:41 AM  Result Value Ref Range   Glucose-Capillary 113 (H) 70 - 99 mg/dL    Comment: Glucose reference range applies only to samples taken after fasting for  at least 8 hours.  Glucose, capillary     Status: Abnormal   Collection Time: 06/02/19  8:09 AM  Result Value Ref Range   Glucose-Capillary 116 (H) 70 - 99 mg/dL    Comment: Glucose reference range applies only to samples taken after fasting for at least 8 hours.  Comprehensive metabolic panel     Status: Abnormal   Collection Time: 06/02/19 11:25 AM  Result Value Ref Range   Sodium 138 135 - 145 mmol/L   Potassium 3.4 (L) 3.5 - 5.1 mmol/L   Chloride 104 98 - 111 mmol/L   CO2 25 22 - 32 mmol/L   Glucose, Bld 178 (H) 70 - 99 mg/dL    Comment: Glucose reference range applies only to samples taken after fasting for at least 8 hours.   BUN 12 8 - 23 mg/dL   Creatinine, Ser 0.55 0.44 - 1.00 mg/dL   Calcium 8.1 (L) 8.9 - 10.3 mg/dL   Total Protein 4.6 (L) 6.5 - 8.1 g/dL   Albumin 2.2 (L)  3.5 - 5.0 g/dL   AST 184 (H) 15 - 41 U/L   ALT 276 (H) 0 - 44 U/L   Alkaline Phosphatase 488 (H) 38 - 126 U/L   Total Bilirubin 1.5 (H) 0.3 - 1.2 mg/dL   GFR calc non Af Amer >60 >60 mL/min   GFR calc Af Amer >60 >60 mL/min   Anion gap 9 5 - 15    Comment: Performed at [facility] [address], [place], [place] [address]  CBC with Differential/Platelet     Status: Abnormal   Collection Time: 06/02/19 11:25 AM  Result Value Ref Range   WBC 7.3 4.0 - 10.5 K/uL   RBC 2.94 (L) 3.87 - 5.11 MIL/uL   Hemoglobin 9.1 (L) 12.0 - 15.0 g/dL   HCT 29.0 (L) 36.0 - 46.0 %   MCV 98.6 80.0 - 100.0 fL   MCH 31.0 26.0 - 34.0 pg   MCHC 31.4 30.0 - 36.0 g/dL   RDW 16.5 (H) 11.5 - 15.5 %   Platelets 343 150 - 400 K/uL   nRBC 0.0 0.0 - 0.2 %   Neutrophils Relative % 81 %   Neutro Abs 6.0 1.7 - 7.7 K/uL   Lymphocytes Relative 9 %   Lymphs Abs 0.6 (L) 0.7 - 4.0 K/uL   Monocytes Relative 8 %   Monocytes Absolute 0.6 0.1 - 1.0 K/uL   Eosinophils Relative 1 %   Eosinophils Absolute 0.1 0.0 - 0.5 K/uL   Basophils Relative 0 %   Basophils Absolute 0.0 0.0 - 0.1 K/uL   Immature Granulocytes 1 %   Abs Immature Granulocytes 0.05 0.00 - 0.07 K/uL    Comment: Performed at [facility], [place] [address] [place], [place] [address]  Magnesium     Status: None   Collection Time: 06/02/19 11:25 AM  Result Value Ref Range   Magnesium 1.7 1.7 - 2.4 mg/dL    Comment: Performed at [facility], [place] [address] [place], [place] [address]  Glucose, capillary     Status: Abnormal   Collection Time: 06/02/19 11:59 AM  Result Value Ref Range   Glucose-Capillary 148 (H) 70 - 99 mg/dL    Comment: Glucose reference range applies only to samples taken after fasting for at least 8 hours.  Glucose, capillary     Status: Abnormal   Collection Time: 06/02/19  3:55 PM  Result Value Ref Range  Glucose-Capillary 151 (H) 70 - 99 mg/dL    Comment: Glucose reference range applies only to samples taken after  fasting for at least 8 hours.  Prealbumin     Status: Abnormal   Collection Time: 06/02/19  8:09 PM  Result Value Ref Range   Prealbumin 7.5 (L) 18 - 38 mg/dL    Comment: Performed at [facility] [address] [place], [place] [address]  Glucose, capillary     Status: Abnormal   Collection Time: 06/02/19  9:07 PM  Result Value Ref Range   Glucose-Capillary 154 (H) 70 - 99 mg/dL    Comment: Glucose reference range applies only to samples taken after fasting for at least 8 hours.  Glucose, capillary     Status: None   Collection Time: 06/02/19 11:59 PM  Result Value Ref Range   Glucose-Capillary 97 70 - 99 mg/dL    Comment: Glucose reference range applies only to samples taken after fasting for at least 8 hours.  Glucose, capillary     Status: None   Collection Time: 06/03/19  4:00 AM  Result Value Ref Range   Glucose-Capillary 92 70 - 99 mg/dL    Comment: Glucose reference range applies only to samples taken after fasting for at least 8 hours.  CBC     Status: Abnormal   Collection Time: 06/03/19  4:34 AM  Result Value Ref Range   WBC 7.6 4.0 - 10.5 K/uL   RBC 2.76 (L) 3.87 - 5.11 MIL/uL   Hemoglobin 8.5 (L) 12.0 - 15.0 g/dL   HCT 27.2 (L) 36.0 - 46.0 %   MCV 98.6 80.0 - 100.0 fL   MCH 30.8 26.0 - 34.0 pg   MCHC 31.3 30.0 - 36.0 g/dL   RDW 16.5 (H) 11.5 - 15.5 %   Platelets 281 150 - 400 K/uL   nRBC 0.0 0.0 - 0.2 %    Comment: Performed at [facility] [address], [place], [place] [address]  Heparin level (unfractionated)     Status: Abnormal   Collection Time: 06/03/19  4:34 AM  Result Value Ref Range   Heparin Unfractionated 1.06 (H) 0.30 - 0.70 IU/mL    Comment: RESULTS CONFIRMED BY MANUAL DILUTION (NOTE) If heparin results are below expected values, and patient dosage has  been confirmed, suggest follow up testing of antithrombin III levels. Performed at [facility], [place] [address], [place], [place] [address]   Comprehensive metabolic panel      Status: Abnormal   Collection Time: 06/03/19  4:34 AM  Result Value Ref Range   Sodium 137 135 - 145 mmol/L   Potassium 3.2 (L) 3.5 - 5.1 mmol/L   Chloride 105 98 - 111 mmol/L   CO2 25 22 - 32 mmol/L   Glucose, Bld 104 (H) 70 - 99 mg/dL    Comment: Glucose reference range applies only to samples taken after fasting for at least 8 hours.   BUN 8 8 - 23 mg/dL   Creatinine, Ser 0.41 (L) 0.44 - 1.00 mg/dL   Calcium 8.2 (L) 8.9 - 10.3 mg/dL   Total Protein 4.5 (L) 6.5 - 8.1 g/dL   Albumin 2.1 (L) 3.5 - 5.0 g/dL   AST 98 (H) 15 - 41 U/L   ALT 209 (H) 0 - 44 U/L   Alkaline Phosphatase 373 (H) 38 - 126 U/L   Total Bilirubin 1.3 (H) 0.3 - 1.2 mg/dL   GFR calc non Af Amer >60 >  60 mL/min   GFR calc Af Amer >60 >60 mL/min   Anion gap 7 5 - 15    Comment: Performed at [facility] [address], [place], [place] [address]  Magnesium     Status: Abnormal   Collection Time: 06/03/19  4:34 AM  Result Value Ref Range   Magnesium 1.6 (L) 1.7 - 2.4 mg/dL    Comment: Performed at [facility] [address], [place], [place] [address]  Glucose, capillary     Status: Abnormal   Collection Time: 06/03/19  8:12 AM  Result Value Ref Range   Glucose-Capillary 101 (H) 70 - 99 mg/dL    Comment: Glucose reference range applies only to samples taken after fasting for at least 8 hours.    Studies/Results: DG ERCP BILIARY & PANCREATIC DUCTS  Result Date: 06/01/2019 CLINICAL DATA:  Biliary dilatation to the pancreatic head with associated pancreatic duct dilatation. Concern for pancreatic head mass. EXAM: ERCP with FNA biopsy and CBD stent TECHNIQUE: Multiple spot images obtained with the fluoroscopic device and submitted for interpretation post-procedure. FLUOROSCOPY TIME:  Fluoroscopy Time:  5 minutes 9 seconds Radiation Exposure Index (if provided by the fluoroscopic device): 101 Number of Acquired Spot Images: 7 COMPARISON:  05/29/2019, 05/30/2019 FINDINGS: Spot fluoroscopic views during  the ERCP procedure demonstrate previous cholecystectomy. Marked biliary dilatation. CBD expandable stent inserted. Please refer to the ERCP procedure note for further details of the intervention. IMPRESSION: Biliary dilatation correlates with the CT and MRI. Endoscopic distal CBD wall flex stent inserted. These images were submitted for radiologic interpretation only. Please see the procedural report for the amount of contrast and the fluoroscopy time utilized. Electronically Signed   By: Jerilynn Mages.  Shick M.D.   On: 06/01/2019 13:42    Medications: I have reviewed the patient's current medications.  Assessment: Pancreatic head mass consistent with adenocarcinoma on EUS and FNAC Possible soft tissue growth within distal CBD, cytology brushing compatible with adenocarcinoma Fully covered metal stent placed, LFTs trending down  Plan: Patient likely will be a surgical candidate as per oncology evaluation. However, as she was noted to have omental and peritoneal nodules on MRI, possibly not a surgical candidate as per surgical evaluation, Dr. Barry Dienes to review imaging. From GI standpoint, recommend high-protein diet, nutritional evaluation. Please recall GI if needed.  Ronnette Juniper, MD 06/03/2019, 12:35 PM

## 2019-06-03 NOTE — Consult Note (Signed)
Referral MD  Reason for Referral: Pancreatic adenocarcinoma-painless jaundice  Chief Complaint  Patient presents with  . Nausea  : I came in because of nausea and weakness.  HPI: Tracy Odonnell is a very nice 79 year old white female.  She has been in good health.  She has a past history of uterine cancer.  A couple months ago she was doing fairly well.  Then she began to have some problems with nausea.  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  She was placed on Eliquis.  About a month ago, she had vomiting.  She was felt to have some bowel issues.  He was felt that she had bowel obstruction because of adhesions.  She had NG tube that was placed.  She subsequently presented to the emergency room at [facility].  This was on 05/29/2019.  She was having some vomiting.  She said it was brownish-green in color.  She is having some abnormal pain in the right upper quadrant.  She was found to have some elevated liver function studies.  Her bilirubin was 4.1.  Her AST was 393 and ALT was 444.  She initially had a CT scan of the abdomen pelvis.  This was done on 05/29/2019.  She had intrahepatic bile duct dilatation.  This extended to the common hepatic duct.  There is no obvious pancreatic mass.  She had no evidence of disease outside of the perihepatic area.  There is some trace ascites.  She had an MRI on 05/30/2019.  This showed severe intra and extra-hepatic biliary dilatation to the pancreatic head.  There is severe dilatation of the pancreatic duct.  There was subtle ill-defined lesion in the pancreatic head with some soft tissue stranding about the duodenum, portal vein, superior mesenteric vein and superior mesenteric artery.  She had small omental and peritoneal nodules.  She did have a upper endoscopy and ERCP.  She did have a metal stent placed.  No obvious mass was noted.  Some brushings were obtained.  The pathology report [id number]) show malignant cells consistent  with adenocarcinoma.  Her labs today show a bilirubin of 1.3.  Her AST is 98 ALT 209.  Calcium 8.2.  Albumin 2.1.  Her white cell count 7.6.  Hemoglobin 8.5.  Platelet count 281.  Of note, she is on heparin right now.  She did have a Doppler of her legs on 05/30/2019.  She had a thrombus in the left posterior tibial vein left peroneal vein.  Right leg was unremarkable.  She has a family history of pancreatic cancer with her mother.  Her mother was I think 33 years old when she passed away from cancer.  She does not smoke.  She does not drink.  She is retired.  She has no children.  Overall, I would have to say that her performance status is probably ECOG 1.    Past Medical History:  Diagnosis Date  . Cancer ([place])   . DM (diabetes mellitus) ([place])   . Seizures ([place])   . Uterine cancer [facility]   :  Past Surgical History:  Procedure Laterality Date  . ABDOMINAL HYSTERECTOMY    . COLONOSCOPY WITH PROPOFOL N/A 07/30/2016   Procedure: COLONOSCOPY WITH PROPOFOL;  Surgeon: Garlan Fair, MD;  Location: [facility] ENDOSCOPY;  Service: Endoscopy;  Laterality: N/A;  . TONSILLECTOMY    :   Current Facility-Administered Medications:  .  0.9 %  sodium chloride infusion, , Intravenous, Continuous, Alekh, Kshitiz, MD, Last Rate:  50 mL/hr at 06/03/19 0419, New Bag at 06/03/19 0419 .  acetaminophen (TYLENOL) tablet 650 mg, 650 mg, Oral, Q6H PRN **OR** acetaminophen (TYLENOL) suppository 650 mg, 650 mg, Rectal, Q6H PRN, Ronnette Juniper, MD .  apixaban Arne Cleveland) tablet 5 mg, 5 mg, Oral, BID, Wendee Beavers, RPH, 5 mg at 06/02/19 1529 .  docusate sodium (COLACE) capsule 100 mg, 100 mg, Oral, Daily, Lang Snow, FNP, 100 mg at 06/02/19 2133 .  feeding supplement (BOOST / RESOURCE BREEZE) liquid 1 Container, 1 Container, Oral, TID WC, Ronnette Juniper, MD, 1 Container at 06/02/19 1232 .  HYDROmorphone (DILAUDID) injection 0.5-1 mg, 0.5-1 mg, Intravenous, Q3H PRN, Cristal Ford, DO, 1 mg at 06/02/19 2005 .  insulin  aspart (novoLOG) injection 0-15 Units, 0-15 Units, Subcutaneous, Q4H, Ronnette Juniper, MD, 3 Units at 06/02/19 2127 .  insulin glargine (LANTUS) injection 12 Units, 12 Units, Subcutaneous, QHS, Ronnette Juniper, MD, 12 Units at 06/02/19 2126 .  labetalol (NORMODYNE) injection 10 mg, 10 mg, Intravenous, Q6H PRN, Ronnette Juniper, MD .  levETIRAcetam (KEPPRA) IVPB 500 mg/100 mL premix, 500 mg, Intravenous, Q12H, Ronnette Juniper, MD, Last Rate: 400 mL/hr at 06/03/19 0421, 500 mg at 06/03/19 0421 .  ondansetron (ZOFRAN) tablet 4 mg, 4 mg, Oral, Q6H PRN **OR** ondansetron (ZOFRAN) injection 4 mg, 4 mg, Intravenous, Q6H PRN, Ronnette Juniper, MD, 4 mg at 06/01/19 2359 .  pantoprazole (PROTONIX) EC tablet 40 mg, 40 mg, Oral, BID AC, Ronnette Juniper, MD, 40 mg at 06/02/19 1702 .  polyethylene glycol (MIRALAX / GLYCOLAX) packet 17 g, 17 g, Oral, Daily, Lang Snow, FNP .  sodium chloride flush (NS) 0.9 % injection 10-40 mL, 10-40 mL, Intracatheter, Q12H, Ronnette Juniper, MD, 10 mL at 06/02/19 2015 .  sodium chloride flush (NS) 0.9 % injection 10-40 mL, 10-40 mL, Intracatheter, PRN, Ronnette Juniper, MD:  . apixaban  5 mg Oral BID  . docusate sodium  100 mg Oral Daily  . feeding supplement  1 Container Oral TID WC  . insulin aspart  0-15 Units Subcutaneous Q4H  . insulin glargine  12 Units Subcutaneous QHS  . pantoprazole  40 mg Oral BID AC  . polyethylene glycol  17 g Oral Daily  . sodium chloride flush  10-40 mL Intracatheter Q12H  :  Allergies  Allergen Reactions  . Influenza Vaccines Other (See Comments)    PT STATES SHE GETS THE FLU FROM THE VACCINE  :  History reviewed. No pertinent family history.:  Social History   Socioeconomic History  . Marital status: Married    Spouse name: Not on file  . Number of children: Not on file  . Years of education: Not on file  . Highest education level: Not on file  Occupational History  . Not on file  Tobacco Use  . Smoking status: Never Smoker  . Smokeless tobacco: Never Used   Substance and Sexual Activity  . Alcohol use: No  . Drug use: No  . Sexual activity: Yes  Other Topics Concern  . Not on file  Social History Narrative  . Not on file   Social Determinants of Health   Financial Resource Strain:   . Difficulty of Paying Living Expenses:   Food Insecurity:   . Worried About Charity fundraiser in the Last Year:   . Arboriculturist in the Last Year:   Transportation Needs:   . Film/video editor (Medical):   Marland Kitchen Lack of Transportation (Non-Medical):   Physical Activity:   .  Days of Exercise per Week:   . Minutes of Exercise per Session:   Stress:   . Feeling of Stress :   Social Connections:   . Frequency of Communication with Friends and Family:   . Frequency of Social Gatherings with Friends and Family:   . Attends Religious Services:   . Active Member of Clubs or Organizations:   . Attends Archivist Meetings:   Marland Kitchen Marital Status:   Intimate Partner Violence:   . Fear of Current or Ex-Partner:   . Emotionally Abused:   Marland Kitchen Physically Abused:   . Sexually Abused:   :  Review of Systems  Constitutional: Positive for malaise/fatigue.  HENT: Negative.   Eyes: Negative.   Respiratory: Negative.   Cardiovascular: Negative.   Gastrointestinal: Positive for abdominal pain and vomiting.  Genitourinary: Negative.   Musculoskeletal: Negative.   Skin: Negative.   Neurological: Negative.   Endo/Heme/Allergies: Negative.   Psychiatric/Behavioral: Negative.      Exam:  Physical Exam Vitals reviewed.  HENT:     Head: Normocephalic and atraumatic.  Eyes:     Pupils: Pupils are equal, round, and reactive to light.  Cardiovascular:     Rate and Rhythm: Normal rate and regular rhythm.     Heart sounds: Normal heart sounds.  Pulmonary:     Effort: Pulmonary effort is normal.     Breath sounds: Normal breath sounds.  Abdominal:     General: Bowel sounds are normal.     Palpations: Abdomen is soft.  Musculoskeletal:         General: No tenderness or deformity. Normal range of motion.     Cervical back: Normal range of motion.  Lymphadenopathy:     Cervical: No cervical adenopathy.  Skin:    General: Skin is warm and dry.     Findings: No erythema or rash.  Neurological:     Mental Status: She is alert and oriented to person, place, and time.  Psychiatric:        Behavior: Behavior normal.        Thought Content: Thought content normal.        Judgment: Judgment normal.      Patient Vitals for the past 24 hrs:  BP Temp Temp src Pulse Resp SpO2  06/03/19 0405 (!) 140/52 98.3 F (36.8 C) Oral 74 17 98 %  06/02/19 2109 (!) 144/53 97.8 F (36.6 C) Oral 72 16 95 %  06/02/19 1620 (!) 137/51 98.7 F (37.1 C) Oral 69 18 97 %     Recent Labs    06/02/19 1125 06/03/19 0434  WBC 7.3 7.6  HGB 9.1* 8.5*  HCT 29.0* 27.2*  PLT 343 281   Recent Labs    06/02/19 1125 06/03/19 0434  NA 138 137  K 3.4* 3.2*  CL 104 105  CO2 25 25  GLUCOSE 178* 104*  BUN 12 8  CREATININE 0.55 0.41*  CALCIUM 8.1* 8.2*    Blood smear review: None  Pathology: See above    Assessment and Plan: Tracy Odonnell is a very charming 79 year old white female.  Looks like she has pancreatic cancer.  I think the real question now is whether or not this is some that can be resected.  The MRI is somewhat suggestive of the possibility of peritoneal and omental nodules.  We did send off a CA 19-9 to see what that looks like.  I do believe that surgery needs to see her.  At least, surgery could  do a exploratory laparoscopic evaluation to see if there is metastatic disease.  If so, then biopsies could be obtained and molecular studies can be sent off.  Tracy Odonnell was very healthy before she had this episode.  As such, I think it be worthwhile to be aggressive with trying to potentially resect out her disease.  I realize that most pancreatic cancers cannot be resected out and that there is already metastatic disease.  I think we  need to prove that there is metastatic disease.  Again, I think that a laparoscopic exploratory procedure should be a reasonable approach so that we can definitively identify metastatic disease.  She is very nice.  She has a very strong faith.  We had a good prayer session.  I will follow along.  I appreciate everybody's help with Tracy Odonnell.  Lattie Haw, MD  Tracy Odonnell 6:2

## 2019-06-03 NOTE — Progress Notes (Signed)
Per Dr Marin Olp, [facility] request sent on specimen  [phone] DOS 06/01/2019

## 2019-06-03 NOTE — Social Work (Signed)
Benefit check completed for apixaban (Eliquis) 5 mg twice daily x 30 days. Can provide 30 day free card at discharge. Pt not eligible for copay card due to Medicare coverage.   Westley Hummer, MSW, [place] Work

## 2019-06-03 NOTE — Progress Notes (Signed)
Physical Therapy Treatment Patient Details Name: Tracy Odonnell MRN: [id number] DOB: 1940-08-11 Today's Date: 06/03/2019    History of Present Illness Tracy Odonnell is a 79 y.o. female with medical history significant of insulin-dependent diabetes mellitus, seizure, uterine cancer status post hysterectomy, obesity presents to emergency department due to right upper quadrant pain and vomiting since 2 days. Pt states she fell out of her bed trying to put some lotion on her nightstand (05/28/19).  States that she did strike her face against the nightstand.  Denies loss of consciousness.    PT Comments    Pt tolerated treatment well. Pt demonstrates much improved tolerance for ambulation this session, with increased gait distance. Pt does continue to demonstrate reduced gait speed, indicating an increased risk for falls, and will benefit from continued acute PT POC to improve gait quality and dynamic balance to avoid future falls. PT encourages pt to ambulate out of the room at least once daily with progression to multiple times over the weekend.   Follow Up Recommendations  Home health PT;Supervision/Assistance - 24 hour     Equipment Recommendations  None recommended by PT    Recommendations for Other Services       Precautions / Restrictions Precautions Precautions: Fall Restrictions Weight Bearing Restrictions: No    Mobility  Bed Mobility Overal bed mobility: Needs Assistance Bed Mobility: Supine to Sit;Sit to Supine     Supine to sit: Min assist Sit to supine: Min guard      Transfers Overall transfer level: Needs assistance Equipment used: Rolling walker (2 wheeled) Transfers: Sit to/from Stand Sit to Stand: Min guard            Ambulation/Gait Ambulation/Gait assistance: Counsellor (Feet): 120 Feet Assistive device: Rolling walker (2 wheeled) Gait Pattern/deviations: Step-to pattern Gait velocity: reduced Gait velocity interpretation: <1.8 ft/sec,  indicate of risk for recurrent falls General Gait Details: slowed step to gait with reduced step length, slight increase in trunk flexion   Stairs             Wheelchair Mobility    Modified Rankin (Stroke Patients Only)       Balance Overall balance assessment: Mild deficits observed, not formally tested(pt requires UE support for standing activity and ambulation)                                          Cognition Arousal/Alertness: Awake/alert Behavior During Therapy: WFL for tasks assessed/performed Overall Cognitive Status: Within Functional Limits for tasks assessed                                        Exercises      General Comments General comments (skin integrity, edema, etc.): VSS on RA      Pertinent Vitals/Pain Pain Assessment: No/denies pain    Home Living                      Prior Function            PT Goals (current goals can now be found in the care plan section) Acute Rehab PT Goals Patient Stated Goal: feel better Progress towards PT goals: Progressing toward goals    Frequency    Min 3X/week      PT  Plan Current plan remains appropriate    Co-evaluation              AM-PAC PT "6 Clicks" Mobility   Outcome Measure  Help needed turning from your back to your side while in a flat bed without using bedrails?: A Little Help needed moving from lying on your back to sitting on the side of a flat bed without using bedrails?: A Little Help needed moving to and from a bed to a chair (including a wheelchair)?: A Little Help needed standing up from a chair using your arms (e.g., wheelchair or bedside chair)?: A Little Help needed to walk in hospital room?: A Little Help needed climbing 3-5 steps with a railing? : A Lot 6 Click Score: 17    End of Session   Activity Tolerance: Patient tolerated treatment well Patient left: in bed;with call bell/phone within reach;with bed alarm  set Nurse Communication: Mobility status PT Visit Diagnosis: Muscle weakness (generalized) (M62.81);Difficulty in walking, not elsewhere classified (R26.2)     Time: [id number] PT Time Calculation (min) (ACUTE ONLY): 38 min  Charges:  $Gait Training: 23-37 mins $Therapeutic Activity: 8-22 mins                     Zenaida Niece, PT, DPT Acute Rehabilitation Pager: [phone]    Zenaida Niece 06/03/2019, 5:33 PM

## 2019-06-03 NOTE — Progress Notes (Signed)
Patient ID: Tracy Odonnell, female   DOB: [birth year], 79 y.o.   MRN: [id number]  Hx Uterine cancer and new Pancreatic cancer  Request received for biopsy of omental or peritoneal nodule biopsy per Dr Marin Olp  Imaging reviewed with Dr Earleen Newport All nodules too small to biopsy safely Hopes would be that brush biopsies will suffice for diagnosis  Thank you  Will contact MDs

## 2019-06-04 DIAGNOSIS — E860 Dehydration: Secondary | ICD-10-CM

## 2019-06-04 DIAGNOSIS — R109 Unspecified abdominal pain: Secondary | ICD-10-CM

## 2019-06-04 DIAGNOSIS — C250 Malignant neoplasm of head of pancreas: Principal | ICD-10-CM

## 2019-06-04 DIAGNOSIS — Z7189 Other specified counseling: Secondary | ICD-10-CM

## 2019-06-04 DIAGNOSIS — R17 Unspecified jaundice: Secondary | ICD-10-CM

## 2019-06-04 DIAGNOSIS — E1169 Type 2 diabetes mellitus with other specified complication: Secondary | ICD-10-CM

## 2019-06-04 LAB — TYPE AND SCREEN
ABO/RH(D): B POS
Antibody Screen: NEGATIVE

## 2019-06-04 LAB — GLUCOSE, CAPILLARY
Glucose-Capillary: 113 mg/dL — ABNORMAL HIGH (ref 70–99)
Glucose-Capillary: 147 mg/dL — ABNORMAL HIGH (ref 70–99)
Glucose-Capillary: 149 mg/dL — ABNORMAL HIGH (ref 70–99)
Glucose-Capillary: 180 mg/dL — ABNORMAL HIGH (ref 70–99)
Glucose-Capillary: 68 mg/dL — ABNORMAL LOW (ref 70–99)
Glucose-Capillary: 94 mg/dL (ref 70–99)
Glucose-Capillary: 98 mg/dL (ref 70–99)

## 2019-06-04 LAB — CBC WITH DIFFERENTIAL/PLATELET
Abs Immature Granulocytes: 0.03 10*3/uL (ref 0.00–0.07)
Basophils Absolute: 0 10*3/uL (ref 0.0–0.1)
Basophils Relative: 0 %
Eosinophils Absolute: 0.1 10*3/uL (ref 0.0–0.5)
Eosinophils Relative: 2 %
HCT: 28.3 % — ABNORMAL LOW (ref 36.0–46.0)
Hemoglobin: 9 g/dL — ABNORMAL LOW (ref 12.0–15.0)
Immature Granulocytes: 0 %
Lymphocytes Relative: 7 %
Lymphs Abs: 0.6 10*3/uL — ABNORMAL LOW (ref 0.7–4.0)
MCH: 31.7 pg (ref 26.0–34.0)
MCHC: 31.8 g/dL (ref 30.0–36.0)
MCV: 99.6 fL (ref 80.0–100.0)
Monocytes Absolute: 0.8 10*3/uL (ref 0.1–1.0)
Monocytes Relative: 10 %
Neutro Abs: 6.6 10*3/uL (ref 1.7–7.7)
Neutrophils Relative %: 81 %
Platelets: 292 10*3/uL (ref 150–400)
RBC: 2.84 MIL/uL — ABNORMAL LOW (ref 3.87–5.11)
RDW: 16.1 % — ABNORMAL HIGH (ref 11.5–15.5)
WBC: 8.2 10*3/uL (ref 4.0–10.5)
nRBC: 0 % (ref 0.0–0.2)

## 2019-06-04 LAB — IRON AND TIBC
Iron: 16 ug/dL — ABNORMAL LOW (ref 28–170)
Saturation Ratios: 6 % — ABNORMAL LOW (ref 10.4–31.8)
TIBC: 272 ug/dL (ref 250–450)
UIBC: 256 ug/dL

## 2019-06-04 LAB — ABO/RH: ABO/RH(D): B POS

## 2019-06-04 LAB — COMPREHENSIVE METABOLIC PANEL
ALT: 191 U/L — ABNORMAL HIGH (ref 0–44)
AST: 146 U/L — ABNORMAL HIGH (ref 15–41)
Albumin: 2.1 g/dL — ABNORMAL LOW (ref 3.5–5.0)
Alkaline Phosphatase: 458 U/L — ABNORMAL HIGH (ref 38–126)
Anion gap: 6 (ref 5–15)
BUN: 5 mg/dL — ABNORMAL LOW (ref 8–23)
CO2: 24 mmol/L (ref 22–32)
Calcium: 8.3 mg/dL — ABNORMAL LOW (ref 8.9–10.3)
Chloride: 104 mmol/L (ref 98–111)
Creatinine, Ser: 0.35 mg/dL — ABNORMAL LOW (ref 0.44–1.00)
GFR calc Af Amer: >60 mL/min (ref >60)
GFR calc non Af Amer: >60 mL/min (ref >60)
Glucose, Bld: 149 mg/dL — ABNORMAL HIGH (ref 70–99)
Potassium: 4.2 mmol/L (ref 3.5–5.1)
Sodium: 134 mmol/L — ABNORMAL LOW (ref 135–145)
Total Bilirubin: 1.8 mg/dL — ABNORMAL HIGH (ref 0.3–1.2)
Total Protein: 4.7 g/dL — ABNORMAL LOW (ref 6.5–8.1)

## 2019-06-04 LAB — CANCER ANTIGEN 19-9: CA 19-9: 28059 U/mL — ABNORMAL HIGH (ref 0–35)

## 2019-06-04 LAB — FERRITIN: Ferritin: 65 ng/mL (ref 11–307)

## 2019-06-04 MED ORDER — APIXABAN 5 MG PO TABS
5.0000 mg | ORAL_TABLET | Freq: Two times a day (BID) | ORAL | Status: DC
  Administered 2019-06-04 – 2019-06-05 (×3): 5 mg via ORAL
  Filled 2019-06-04 (×3): qty 1

## 2019-06-04 MED ORDER — INSULIN GLARGINE 100 UNIT/ML ~~LOC~~ SOLN
8.0000 [IU] | Freq: Every day | SUBCUTANEOUS | Status: DC
  Administered 2019-06-04: 8 [IU] via SUBCUTANEOUS
  Filled 2019-06-04 (×2): qty 0.08

## 2019-06-04 MED ORDER — DRONABINOL 2.5 MG PO CAPS
2.5000 mg | ORAL_CAPSULE | Freq: Two times a day (BID) | ORAL | Status: DC
  Administered 2019-06-04 (×2): 2.5 mg via ORAL
  Filled 2019-06-04 (×2): qty 1

## 2019-06-04 MED ORDER — NALOXEGOL OXALATE 12.5 MG PO TABS
12.5000 mg | ORAL_TABLET | Freq: Every day | ORAL | Status: DC
  Administered 2019-06-05: 12.5 mg via ORAL
  Filled 2019-06-04 (×2): qty 1

## 2019-06-04 NOTE — Plan of Care (Signed)

## 2019-06-04 NOTE — Progress Notes (Signed)
Patient ID: Tracy Odonnell, female   DOB: 1940/07/02, 79 y.o.   MRN: [id number]  PROGRESS NOTE    Tracy Odonnell  [id number] DOB: Aug 03, 1940 DOA: 05/29/2019 PCP: Lavone Orn, MD   Brief Narrative:  79 year old female with history of diabetes mellitus type 2, seizure, uterine cancer status post hysterectomy, obesity presented with right upper quadrant pain and vomiting.  She was found to have obstructive jaundice with elevated LFTs.  GI was consulted and patient underwent ERCP and stent placement along with EUS on 06/01/2019.   Assessment & Plan:  Obstructive jaundice with elevated LFTs/new diagnosis of pancreatic head adenocarcinoma --CT abdomen pelvis showed intrahepatic bile duct dilation extending into the common hepatic duct -MRCP showed severe intra and extrahepatic biliary ductal dilatation to the pancreatic head, with severe dilatation of the pancreatic duct in the pancreatic neck and more distally.  Constellation of findings suspicious for pancreatic adenocarcinoma. -Status post ERCP and stent placement in CBD along with EUS on 06/01/2019.  FNAC consistent with adenocarcinoma.  Will consult oncology. -Oral intake still very poor.  Advance diet as tolerated.  Follow nutritional evaluation. -Antibiotics discontinued on 06/02/2019 -Palliative care evaluation pending. -Oncology following and deciding if she needs to be started on chemotherapy.  Patient is not a surgical candidate and also not a candidate for IR guided biopsy of the peritoneal/omental nodules. -LFTs improving.  Monitor.  Diabetes mellitus type 2 -A1c 7.7.  Continue Lantus along with CBGs with SSI.  Hypertension  -Monitor blood pressure  Seizure disorder -Continue Keppra  Bilateral lower extremity DVT --Lower extremity Doppler showed L age-indeterminate DVT of posterior tibial, peroneal veins.  Right no evidence of DVT. -Doppler on 05/16/2019 showed acute DVT of the right and left posterior tibial veins-patient was  placed on Eliquis -Recently, patient discontinued Eliquis at the recommendation of her PCP, unsure as to why. -Eliquis has been restarted  Hypomagnesemia -No labs today.  Repeat a.m. labs.  Mechanical fall -CT head unremarkable for acute finding -Continue fall precautions -PT recommended home health PT.  Chronic normocytic anemia -Hemoglobin stable.  Monitor.  Hypokalemia -No labs today.  Repeat a.m. labs.  Generalized conditioning -With new diagnosis of metastatic pancreatic cancer, patient will need to have palliative care discussions regarding goals of care.  I tried to have some discussion but she is currently not interested to have that discussion.  Overall prognosis is guarded to poor. -Currently appetite is also poor.  DVT prophylaxis: Heparin Code Status: Full Family Communication: Spoke to patient  at bedside Disposition Plan: Appetite still poor but improving.  Probable discharge home tomorrow if okay with oncology   consultants: GI/general surgery/oncology/palliative care  Procedures:  ERCP and stent placement in CBD on 06/01/2019 EUS on 06/01/2019  Antimicrobials: Zosyn 05/29/2019-06/02/2019   Subjective: Patient seen and examined at bedside.  Her oral intake is still very poor.  Finally had a bowel movement yesterday evening.  Intermittently complains of abdominal pain with some nausea.  Denies any fever, worsening shortness of breath.    Objective: Vitals:   06/03/19 1514 06/03/19 2100 06/04/19 0519 06/04/19 0804  BP: (!) 131/55 (!) 146/61 (!) 135/55   Pulse: 78 83 75   Resp: 17 15 15 16   Temp: 97.9 F (36.6 C) 98.2 F (36.8 C) 98.3 F (36.8 C)   TempSrc: Oral Oral Oral   SpO2: 92% 96% 97%   Weight:      Height:        Intake/Output Summary (Last 24 hours) at 06/04/2019 1049 Last  data filed at 06/04/2019 0805 Gross per 24 hour  Intake 490 ml  Output --  Net 490 ml   Filed Weights   05/29/19 1224 06/01/19 1025  Weight: 81.2 kg 81.2 kg     Examination:  General exam: No acute distress.  Looks chronically ill. Respiratory system: Bilateral decreased breath sounds at bases, no wheezing cardiovascular system: S1-S2 heard, rate controlled Gastrointestinal system: Abdomen is mildly distended, soft and nontender.  Normal bowel sounds are heard Extremities: Bilateral lower extremity trace edema.  No cyanosis   Data Reviewed: I have personally reviewed following labs and imaging studies  CBC: Recent Labs  Lab 05/29/19 1238 05/30/19 0939 05/31/19 0411 06/01/19 0417 06/02/19 0154 06/02/19 1125 06/03/19 0434  WBC 10.6*   < > 6.8 5.9 8.2 7.3 7.6  NEUTROABS 9.7*  --   --   --   --  6.0  --   HGB 11.8*   < > 8.9* 9.2* 9.8* 9.1* 8.5*  HCT 36.6   < > 28.5* 28.8* 30.8* 29.0* 27.2*  MCV 95.1   < > 98.6 98.0 99.0 98.6 98.6  PLT 372   < > 272 278 352 343 281   < > = values in this interval not displayed.   Basic Metabolic Panel: Recent Labs  Lab 05/29/19 1238 05/29/19 1648 05/30/19 0939 05/31/19 0411 05/31/19 0656 06/01/19 0417 06/02/19 1125 06/03/19 0434  NA   < >  --  143 140  --  139 138 137  K   < >  --  3.8 2.4*  --  3.1* 3.4* 3.2*  CL   < >  --  103 104  --  103 104 105  CO2   < >  --  30 27  --  25 25 25   GLUCOSE   < >  --  153* 116*  --  89 178* 104*  BUN   < >  --  13 9  --  7* 12 8  CREATININE   < >  --  0.47 0.41*  --  0.40* 0.55 0.41*  CALCIUM   < >  --  8.7* 8.1*  --  8.5* 8.1* 8.2*  MG  --  2.0  --   --  1.8  --  1.7 1.6*   < > = values in this interval not displayed.   GFR: Estimated Creatinine Clearance: 59 mL/min (A) (by C-G formula based on SCr of 0.41 mg/dL (L)). Liver Function Tests: Recent Labs  Lab 05/30/19 0939 05/31/19 0411 06/01/19 0417 06/02/19 1125 06/03/19 0434  AST 312* 228* 162* 184* 98*  ALT 351* 289* 249* 276* 209*  ALKPHOS 562* 443* 417* 488* 373*  BILITOT 2.7* 2.0* 1.8* 1.5* 1.3*  PROT 5.1* 4.6* 4.9* 4.6* 4.5*  ALBUMIN 2.5* 2.2* 2.2* 2.2* 2.1*   Recent Labs  Lab  05/29/19 1238  LIPASE 40   Recent Labs  Lab 05/29/19 1238  AMMONIA 36*   Coagulation Profile: Recent Labs  Lab 05/29/19 1238  INR 1.0   Cardiac Enzymes: No results for input(s): CKTOTAL, CKMB, CKMBINDEX, TROPONINI in the last 168 hours. BNP (last 3 results) No results for input(s): PROBNP in the last 8760 hours. HbA1C: No results for input(s): HGBA1C in the last 72 hours. CBG: Recent Labs  Lab 06/03/19 1819 06/03/19 2056 06/04/19 0034 06/04/19 0420 06/04/19 0813  GLUCAP 220* 194* 180* 94 68*   Lipid Profile: No results for input(s): CHOL, HDL, LDLCALC, TRIG, CHOLHDL, LDLDIRECT in the last 72  hours. Thyroid Function Tests: No results for input(s): TSH, T4TOTAL, FREET4, T3FREE, THYROIDAB in the last 72 hours. Anemia Panel: No results for input(s): VITAMINB12, FOLATE, FERRITIN, TIBC, IRON, RETICCTPCT in the last 72 hours. Sepsis Labs: Recent Labs  Lab 05/29/19 1823  LATICACIDVEN 1.1    Recent Results (from the past 240 hour(s))  SARS CORONAVIRUS 2 (TAT 6-24 HRS) Nasopharyngeal Nasopharyngeal Swab     Status: None   Collection Time: 05/29/19  4:39 PM   Specimen: Nasopharyngeal Swab  Result Value Ref Range Status   SARS Coronavirus 2 NEGATIVE NEGATIVE Final    Comment: (NOTE) SARS-CoV-2 target nucleic acids are NOT DETECTED. The SARS-CoV-2 RNA is generally detectable in upper and lower respiratory specimens during the acute phase of infection. Negative results do not preclude SARS-CoV-2 infection, do not rule out co-infections with other pathogens, and should not be used as the sole basis for treatment or other patient management decisions. Negative results must be combined with clinical observations, patient history, and epidemiological information. The expected result is Negative. Fact Sheet for Patients: SugarRoll.be Fact Sheet for Healthcare Providers: https://www.woods-mathews.com/ This test is not yet approved or  cleared by the [place] FDA and  has been authorized for detection and/or diagnosis of SARS-CoV-2 by FDA under an Emergency Use Authorization (EUA). This EUA will remain  in effect (meaning this test can be used) for the duration of the COVID-19 declaration under Section 56 4(b)(1) of the Act, 21 U.S.C. section 360bbb-3(b)(1), unless the authorization is terminated or revoked sooner. Performed at [facility], [place] [address], [place], [place] [address]   Culture, blood (routine x 2)     Status: None   Collection Time: 05/29/19  4:48 PM   Specimen: BLOOD  Result Value Ref Range Status   Specimen Description BLOOD RIGHT ARM  Final   Special Requests   Final    BOTTLES DRAWN AEROBIC AND ANAEROBIC Blood Culture adequate volume   Culture   Final    NO GROWTH 5 DAYS Performed at [facility], [place] [address], [place], [place] [address]    Report Status 06/03/2019 FINAL  Final  Culture, blood (routine x 2)     Status: None   Collection Time: 05/29/19  6:23 PM   Specimen: BLOOD  Result Value Ref Range Status   Specimen Description BLOOD RIGHT ARM  Final   Special Requests   Final    BOTTLES DRAWN AEROBIC ONLY Blood Culture adequate volume   Culture   Final    NO GROWTH 5 DAYS Performed at [facility], [place] [address], [place], [place] [address]    Report Status 06/03/2019 FINAL  Final         Radiology Studies: No results found.      Scheduled Meds: . apixaban  5 mg Oral BID  . dronabinol  2.5 mg Oral BID AC  . feeding supplement (GLUCERNA SHAKE)  237 mL Oral BID BM  . insulin aspart  0-15 Units Subcutaneous Q4H  . insulin glargine  12 Units Subcutaneous QHS  . multivitamin with minerals  1 tablet Oral Daily  . naloxegol oxalate  12.5 mg Oral Daily  . pantoprazole  40 mg Oral BID AC  . polyethylene glycol  17 g Oral Daily  . Ensure Max Protein  11 oz Oral BID  . senna-docusate  1 tablet Oral BID  . sodium chloride flush  10-40 mL Intracatheter  Q12H   Continuous Infusions: . sodium chloride 50  mL/hr at 06/04/19 0029  . levETIRAcetam 500 mg (06/04/19 0423)          Aline August, MD [facility] 06/04/2019, 10:49 AM

## 2019-06-04 NOTE — Progress Notes (Signed)
Physical Therapy Treatment Patient Details Name: Tracy Odonnell MRN: [id number] DOB: Aug 16, 1940 Today's Date: 06/04/2019    History of Present Illness Tracy Odonnell is a 79 y.o. female with medical history significant of insulin-dependent diabetes mellitus, seizure, uterine cancer status post hysterectomy, obesity presents to emergency department due to right upper quadrant pain and vomiting since 2 days. Pt states she fell out of her bed trying to put some lotion on her nightstand (05/28/19).  States that she did strike her face against the nightstand.  Denies loss of consciousness.    PT Comments    Pt tolerated treatment well, further increasing ambulation tolerance this session. Pt continues to demonstrate LE power deficits with slowed gait speed and labored transfers. PT provides HEP education for pt to perform sit to stands from recliner with family or staff assistance to improve LE power. Pt will continue to benefit from acute PT POC to improve functional mobility, dynamic balance, and power, to reduce falls risk and restore independence.   Follow Up Recommendations  Home health PT;Supervision/Assistance - 24 hour     Equipment Recommendations  None recommended by PT    Recommendations for Other Services       Precautions / Restrictions Precautions Precautions: Fall Restrictions Weight Bearing Restrictions: No    Mobility  Bed Mobility Overal bed mobility: Needs Assistance Bed Mobility: Supine to Sit     Supine to sit: Min assist        Transfers Overall transfer level: Needs assistance Equipment used: Rolling walker (2 wheeled) Transfers: Sit to/from Stand Sit to Stand: Min guard            Ambulation/Gait Ambulation/Gait assistance: Supervision Gait Distance (Feet): 250 Feet Assistive device: Rolling walker (2 wheeled) Gait Pattern/deviations: Step-to pattern Gait velocity: reduced Gait velocity interpretation: <1.8 ft/sec, indicate of risk for recurrent  falls General Gait Details: pt with shortened and slowed step to gait   Stairs             Wheelchair Mobility    Modified Rankin (Stroke Patients Only)       Balance Overall balance assessment: Mild deficits observed, not formally tested                                          Cognition Arousal/Alertness: Awake/alert Behavior During Therapy: WFL for tasks assessed/performed Overall Cognitive Status: Within Functional Limits for tasks assessed                                        Exercises Other Exercises Other Exercises: pt educated on performing sit to stands from chair with family supervision to improve LE power    General Comments General comments (skin integrity, edema, etc.): VSS on RA      Pertinent Vitals/Pain Pain Assessment: No/denies pain    Home Living                      Prior Function            PT Goals (current goals can now be found in the care plan section) Acute Rehab PT Goals Patient Stated Goal: feel better Progress towards PT goals: Progressing toward goals    Frequency    Min 3X/week      PT Plan Current  plan remains appropriate    Co-evaluation              AM-PAC PT "6 Clicks" Mobility   Outcome Measure  Help needed turning from your back to your side while in a flat bed without using bedrails?: A Little Help needed moving from lying on your back to sitting on the side of a flat bed without using bedrails?: A Little Help needed moving to and from a bed to a chair (including a wheelchair)?: A Little Help needed standing up from a chair using your arms (e.g., wheelchair or bedside chair)?: A Little Help needed to walk in hospital room?: A Little Help needed climbing 3-5 steps with a railing? : A Lot 6 Click Score: 17    End of Session   Activity Tolerance: Patient tolerated treatment well Patient left: in chair;with call bell/phone within reach;with family/visitor  present Nurse Communication: Mobility status PT Visit Diagnosis: Muscle weakness (generalized) (M62.81);Difficulty in walking, not elsewhere classified (R26.2)     Time: [id number] PT Time Calculation (min) (ACUTE ONLY): 35 min  Charges:  $Gait Training: 23-37 mins                     Zenaida Niece, PT, DPT Acute Rehabilitation Pager: [phone]    Zenaida Niece 06/04/2019, 5:46 PM

## 2019-06-04 NOTE — Progress Notes (Signed)
ANTICOAGULATION CONSULT NOTE - Follow Up Consult  Pharmacy Consult for Eliquis Indication: DVT (05/16/19)  Allergies  Allergen Reactions  . Influenza Vaccines Other (See Comments)    PT STATES SHE GETS THE FLU FROM THE VACCINE    Patient Measurements: Height: 5\' 2"  (157.5 cm) Weight: 81.2 kg (179 lb) IBW/kg (Calculated) : 50.1  Vital Signs: Temp: 98.3 F (36.8 C) (04/09 0519) Temp Source: Oral (04/09 0519) BP: 135/55 (04/09 0519) Pulse Rate: 75 (04/09 0519)  Labs: Recent Labs    06/02/19 0148 06/02/19 0154 06/02/19 0154 06/02/19 1125 06/03/19 0434  HGB  --  9.8*   < > 9.1* 8.5*  HCT  --  30.8*  --  29.0* 27.2*  PLT  --  352  --  343 281  HEPARINUNFRC 0.34  --   --   --  1.06*  CREATININE  --   --   --  0.55 0.41*   < > = values in this interval not displayed.    Estimated Creatinine Clearance: 59 mL/min (A) (by C-G formula based on SCr of 0.41 mg/dL (L)).  Assessment:  88 yoF admitted for elevated liver enzymes. Pt was recently [facility]12[facility] for bowel obstruction and found to have bilateral DVT. Pt was sent home on Eliquis starter pack; however, on 3/30 outpt MD Dr. Laurann Montana instructed pt to stop the medication, so pt has not been on anticoagulation since then. During this admission, a repeat Doppler was done showing age-indeterminate DVT in left leg. Pharmacy consulted to dose heparin on 4/4 > transitioned back to Eliquis on 4/7, then stopped 4/8 pm for possible biopsy or surgery.  Nodules noted too small to biopsy and not surgical candidate.  Eliqius to resume today.     Today's labs are pending. Hgb has trended down since admitted, no bleeding reported.  LFTs trending down. S/p CBD stent on 4/6.   Goal of Therapy:  appropriate Eliquis dose for indication Monitor platelets by anticoagulation protocol: Yes   Plan:   Resume Eliquis 5 mg PO BID.  Follow up CBC, monitor for s/sx bleeding.  Arty Baumgartner, [place] Phone:  [phone] 06/04/2019,10:37 AM

## 2019-06-04 NOTE — Consult Note (Signed)
Consultation Note Date: 06/04/2019   Patient Name: Tracy Odonnell  DOB: 1940-07-21  MRN: [id number]  Age / Sex: 79 y.o., female   PCP: Lavone Orn, MD Referring Physician: Aline August, MD   REASON FOR CONSULTATION:Establishing goals of care  Palliative Care consult requested for goals of care discussion in this 79 y.o. female with multiple medical problems including insulin-dependent diabetes mellitus, seizure, uterine cancer status post hysterectomy, obesity, DVT in right and left posterior tibial vein (05/16/19 Eliquis d/c by PCP), and hypertension. Patient presented to ED with complaints of right upper quadrant pain, fall, and vomiting x 2 days (dark brown/green in color). She recently was hospitalized at [facility] for 10 days and treated for small bowel obstruction, DKA, and DVT. During her ED work-up AST 393, ALT 444, bilirubin 4.1, ammonia 36. Head CT showed no acute abnormalities. CT abdomen showed intrahepatic bile duct dilation. Since admission patient evaluated by GI and underwent EGD/ERCP with findings of pancreatic head mass consistent with adenocarcinoma. Patient being followed by Oncology (Dr. Marin Olp). Also seen by surgery and deemed not to be a candidate for surgical intervention.   Clinical Assessment and Goals of Care: I have reviewed medical records including lab results, imaging, Epic notes, and MAR, received report from the bedside RN, and assessed the patient. I met at the bedside with patient and her cousin, Purvis Kilts to discuss diagnosis prognosis, [place], EOL wishes, disposition and options.  I introduced Palliative Medicine as specialized medical care for people living with serious illness. It focuses on providing relief from the symptoms and stress of a serious illness. The goal is to improve quality of life for both the patient and the family.   Ms. Mak recently received pain medication for complaints of abdominal pain. She is  beginning to rest comfortable and somewhat drowsy. She was unable to engage in discussions and will require ongoing palliative support and discussions, allowing her time to rest. RN reports patient was up during the night also.   We discussed a brief life review of the patient, along with her functional and nutritional status. Ms. Tryon lives alone. She has been divorced for 2 years with no involvement with ex-husband. She does not have any children. She has a strong support system of friends and family. [name] and Peter Congo are her closes relatives. She is also close friends with her sister-n-law Wilma. She worked for many years and later retired as the Psychiatric nurse at [employer]. She is a woman of strong [place] faith and loves caring for others.   3 months prior patient was independent of all ADLs and a caregiver for a close friend who was also being treated for cancer. She was able to drive with no limitations. Family reports patient has rapidly declined over the past 3-4 weeks since her illness in March and hospitalization. She has become much weaker, poor appetite, and overall functionally declined.   [name] reports patient does not have an advanced directive, however since her illness she has began preparing to rapidly create appropriate documents. She shares patient has expressed that her niece Peter Congo will most likely be her initial HCPOA and [name] will be her back-up. Although Ms. Lindenberger is a strong-willed lady, [name] feels that quality of life means the most to patient. She shares patient has a strong faith belief and is not afraid of her last days. She is unsure what patient's decisions will be regarding her new cancer diagnosis but  knows that she will consider what is most important to her and would not want to suffer.   Rosemary spent time sharing memories that her and patient have made over the years, emphasizing her loving and selfless personality. Therapeutic  listening and support given. The family was encouraged to call with questions or concerns.  PMT will continue to support holistically.    SOCIAL HISTORY:     reports that she has never smoked. She has never used smokeless tobacco. She reports that she does not drink alcohol or use drugs.  CODE STATUS: Full code  ADVANCE DIRECTIVES: Patient    SYMPTOM MANAGEMENT: per attending   Palliative Prophylaxis:   Aspiration, Bowel Regimen and Frequent Pain Assessment  PSYCHO-SOCIAL/SPIRITUAL:  Support System: Family  Desire for further Chaplaincy support:Yes   Additional Recommendations (Limitations, Scope, Preferences):  Full Scope Treatment   PAST MEDICAL HISTORY: Past Medical History:  Diagnosis Date  . Cancer ([place])   . DM (diabetes mellitus) ([place])   . Seizures ([place])   . Uterine cancer ([place])     PAST SURGICAL HISTORY:  Past Surgical History:  Procedure Laterality Date  . ABDOMINAL HYSTERECTOMY    . BILIARY BRUSHING  06/01/2019   Procedure: BILIARY BRUSHING;  Surgeon: Ronnette Juniper, MD;  Location: [facility] ENDOSCOPY;  Service: Gastroenterology;;  . BILIARY STENT PLACEMENT  06/01/2019   Procedure: BILIARY STENT PLACEMENT;  Surgeon: Ronnette Juniper, MD;  Location: [facility] ENDOSCOPY;  Service: Gastroenterology;;  . COLONOSCOPY WITH PROPOFOL N/A 07/30/2016   Procedure: COLONOSCOPY WITH PROPOFOL;  Surgeon: Garlan Fair, MD;  Location: [facility] ENDOSCOPY;  Service: Endoscopy;  Laterality: N/A;  . ENDOSCOPIC RETROGRADE CHOLANGIOPANCREATOGRAPHY (ERCP) WITH PROPOFOL N/A 06/01/2019   Procedure: ENDOSCOPIC RETROGRADE CHOLANGIOPANCREATOGRAPHY (ERCP) WITH PROPOFOL;  Surgeon: Ronnette Juniper, MD;  Location: [place];  Service: Gastroenterology;  Laterality: N/A;  . ESOPHAGOGASTRODUODENOSCOPY (EGD) WITH PROPOFOL N/A 06/01/2019   Procedure: ESOPHAGOGASTRODUODENOSCOPY (EGD) WITH PROPOFOL;  Surgeon: Arta Silence, MD;  Location: [place];  Service: Gastroenterology;  Laterality: N/A;  . FINE NEEDLE ASPIRATION   06/01/2019   Procedure: FINE NEEDLE ASPIRATION (FNA) LINEAR;  Surgeon: Arta Silence, MD;  Location: [facility] ENDOSCOPY;  Service: Gastroenterology;;  . Joan Mayans  06/01/2019   Procedure: SPHINCTEROTOMY;  Surgeon: Ronnette Juniper, MD;  Location: [place];  Service: Gastroenterology;;  . TONSILLECTOMY    . UPPER ESOPHAGEAL ENDOSCOPIC ULTRASOUND (EUS) N/A 06/01/2019   Procedure: UPPER ESOPHAGEAL ENDOSCOPIC ULTRASOUND (EUS);  Surgeon: Arta Silence, MD;  Location: [place];  Service: Gastroenterology;  Laterality: N/A;    ALLERGIES:  is allergic to influenza vaccines.   MEDICATIONS:  Current Facility-Administered Medications  Medication Dose Route Frequency Provider Last Rate Last Admin  . 0.9 %  sodium chloride infusion   Intravenous Continuous Aline August, MD 50 mL/hr at 06/04/19 0029 New Bag at 06/04/19 0029  . acetaminophen (TYLENOL) tablet 650 mg  650 mg Oral Q6H PRN Ronnette Juniper, MD       Or  . acetaminophen (TYLENOL) suppository 650 mg  650 mg Rectal Q6H PRN Ronnette Juniper, MD      . apixaban Arne [place]) tablet 5 mg  5 mg Oral BID Skeet Simmer, RPH   5 mg at 06/04/19 0954  . dronabinol (MARINOL) capsule 2.5 mg  2.5 mg Oral BID AC Volanda Napoleon, MD   2.5 mg at 06/04/19 1742  . feeding supplement (GLUCERNA SHAKE) (GLUCERNA SHAKE) liquid 237 mL  237 mL Oral BID BM Alekh, Kshitiz, MD   237 mL at 06/04/19 1308  . HYDROmorphone (DILAUDID)  injection 0.5-1 mg  0.5-1 mg Intravenous Q3H PRN Cristal Ford, DO   1 mg at 06/04/19 1323  . insulin aspart (novoLOG) injection 0-15 Units  0-15 Units Subcutaneous Q4H Ronnette Juniper, MD   2 Units at 06/04/19 1308  . insulin glargine (LANTUS) injection 8 Units  8 Units Subcutaneous QHS Alekh, Kshitiz, MD      . labetalol (NORMODYNE) injection 10 mg  10 mg Intravenous Q6H PRN Ronnette Juniper, MD      . levETIRAcetam (KEPPRA) IVPB 500 mg/100 mL premix  500 mg Intravenous Q12H Ronnette Juniper, MD 400 mL/hr at 06/04/19 1744 500 mg at 06/04/19 1744  . multivitamin  with minerals tablet 1 tablet  1 tablet Oral Daily Aline August, MD   1 tablet at 06/04/19 0954  . naloxegol oxalate (MOVANTIK) tablet 12.5 mg  12.5 mg Oral Daily Volanda Napoleon, MD      . ondansetron [facility]) tablet 4 mg  4 mg Oral Q6H PRN Ronnette Juniper, MD       Or  . ondansetron [facility]) injection 4 mg  4 mg Intravenous Q6H PRN Ronnette Juniper, MD   4 mg at 06/03/19 0844  . pantoprazole (PROTONIX) EC tablet 40 mg  40 mg Oral BID AC Ronnette Juniper, MD   40 mg at 06/04/19 1742  . polyethylene glycol (MIRALAX / GLYCOLAX) packet 17 g  17 g Oral Daily Lang Snow, FNP   17 g at 06/03/19 0847  . protein supplement (ENSURE MAX) liquid  11 oz Oral BID Aline August, MD   11 oz at 06/04/19 0954  . senna-docusate (Senokot-S) tablet 1 tablet  1 tablet Oral BID Aline August, MD   1 tablet at 06/03/19 2133  . sodium chloride flush (NS) 0.9 % injection 10-40 mL  10-40 mL Intracatheter Q12H Ronnette Juniper, MD   10 mL at 06/04/19 0955  . sodium chloride flush (NS) 0.9 % injection 10-40 mL  10-40 mL Intracatheter PRN Ronnette Juniper, MD        VITAL SIGNS: BP (!) 115/52 (BP Location: Right Arm)   Pulse 71   Temp 98.4 F (36.9 C) (Oral)   Resp 14   Ht '5\' 2"'  (1.575 m)   Wt 81.2 kg   LMP  (LMP Unknown)   SpO2 96%   BMI 32.74 kg/m  Filed Weights   05/29/19 1224 06/01/19 1025  Weight: 81.2 kg 81.2 kg    Estimated body mass index is 32.74 kg/m as calculated from the following:   Height as of this encounter: '5\' 2"'  (1.575 m).   Weight as of this encounter: 81.2 kg.  LABS: CBC:    Component Value Date/Time   WBC 8.2 06/04/2019 1200   HGB 9.0 (L) 06/04/2019 1200   HCT 28.3 (L) 06/04/2019 1200   PLT 292 06/04/2019 1200   Comprehensive Metabolic Panel:    Component Value Date/Time   NA 134 (L) 06/04/2019 1200   K 4.2 06/04/2019 1200   CO2 24 06/04/2019 1200   BUN 5 (L) 06/04/2019 1200   CREATININE 0.35 (L) 06/04/2019 1200   ALBUMIN 2.1 (L) 06/04/2019 1200     Review of Systems    Constitutional: Positive for activity change and appetite change.  Gastrointestinal: Positive for abdominal distention and abdominal pain.  Neurological: Positive for weakness.   Physical Exam General: NAD, chronically ill-appearing Cardiovascular: regular rate and rhythm Pulmonary: normal respiratory efforts, diminished bilaterally  Abdomen: soft, tender, + bowel sounds Neurological: drowsy s/p medications, A&O x3   Prognosis:  Guarded to Poor in the setting of metastatic pancreatic carcinoma (not a surgical candidate), anemia, hypokalemia, seizure disorder, hypertension, diabetes, poor po intake  Discharge Planning:  To Be Determined  Recommendations:  Full Code  Continue current plan of care per medical team  Patient drowsy and prefers to rest given recent pain medication for abdominal pain. Unable to fully engage in goals of care discussion. Will require follow up and ongoing goals of care discussion.   PMT will continue to follow and support. I will be off service over the weekend. My colleague Anders Grant will follow up with patient and family over the weekend for continued discussion.    Palliative Performance Scale: PPS 30-40%                Rosemary expressed understanding and was in agreement with this plan. She is aware Palliative will continue to support and engage in further discussion over the weekend.   Thank you for allowing the Palliative Medicine Team to assist in the care of this patient.  Time In: 1430 Time Out: 1515 Time Total: 45 min.   Visit consisted of counseling and education dealing with the complex and emotionally intense issues of symptom management and palliative care in the setting of serious and potentially life-threatening illness.Greater than 50%  of this time was spent counseling and coordinating care related to the above assessment and plan.  Signed by:  Alda Lea, AGPCNP-BC Palliative Medicine Team  Phone:  [phone] Fax: [phone] Pager: [phone] Amion: Bjorn Pippin

## 2019-06-04 NOTE — Progress Notes (Signed)
Inpatient Diabetes Program Recommendations  AACE/ADA: New Consensus Statement on Inpatient Glycemic Control (2015)  Target Ranges:  Prepandial:   less than 140 mg/dL      Peak postprandial:   less than 180 mg/dL (1-2 hours)      Critically ill patients:  140 - 180 mg/dL   Lab Results  Component Value Date   GLUCAP 68 (L) 06/04/2019   HGBA1C 7.7 (H) 05/29/2019    Review of Glycemic Control Results for Tracy Odonnell, Tracy Odonnell (MRN [id number]) as of 06/04/2019 09:02  Ref. Range 06/03/2019 18:19 06/03/2019 20:56 06/04/2019 00:34 06/04/2019 04:20 06/04/2019 08:13  Glucose-Capillary Latest Ref Range: 70 - 99 mg/dL 220 (H) 194 (H) 180 (H) 94 68 (L)   Diabetes history: DM 2 Outpatient Diabetes medications: Basaglar 12 units Daily Current orders for Inpatient glycemic control:  Lantus 12 units Novolog 0-15 units Q4 hours  Glucerna bid between meals Ensure max bid  Inpatient Diabetes Program Recommendations:    Hypoglycemia 68 this am. Consider reducing Lantus to 10 units.  Thanks,  Tama Headings RN, MSN, BC-ADM Inpatient Diabetes Coordinator Team Pager [phone] (8a-5p)

## 2019-06-04 NOTE — Progress Notes (Signed)
Late Entry from 4/8:  I spoke with the patient and her family who were at the bedside to inform them that the patent is not a surgical candidate for resection given the metastatic disease noted on her MRCP.  She and her family understood, but were initially confused as they had not apparently been informed of this finding.  All questions were encouraged and answered in regards to surgery as I deferred further questions regarding treatment to her oncologist.  They were appreciative of the information and the visit.  Henreitta Cea

## 2019-06-04 NOTE — Progress Notes (Signed)
Unfortunately, I believe that Tracy Odonnell does have metastatic disease.  Her CA 19-9 is over 28,000.  I think this is a very accurate indicator of the extent of her disease.  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  I suspect this probably is what led to her having the bowel obstruction.  She is not a surgical candidate at this point.  We do not have to get additional biopsies.  She finally had a bowel movement this morning.  It would not surprise me if she has issues with going to the bathroom.  I think she has tumor involving her intestinal track, she probably is going to have issues with her bowels.  I know that it is quite distressing and somewhat depressing that she is not a surgical candidate.  I still think that she would be a candidate for systemic therapy.  I think that she probably would be reasonable for Abraxane/gemcitabine.  I do not think that she would be a good candidate for FOLFIRINOX.  Sounds like she was a little bit more active yesterday.  I am not sure she is eating regular food or not as of yet.  There are no labs that are back yet.  I noted that her prealbumin is only 7.5.  This might be considered an ominous prognostic factor.  Her vital signs all look stable.  Her blood pressure is 135/55.  Temperature is 98.3.  Pulse is 75.  Her abdomen is slightly distended.  There is no guarding or rebound tenderness.  Bowel sounds are present but may be slightly decreased.  There is no obvious fluid wave.  Lungs are clear bilaterally.  Cardiac exam regular rate and rhythm.  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  She had been on heparin I thought.  I need to make sure that she gets on an oral agent.  I thought she was on Eliquis.  She really needs to have lab work done today as we see what her hemoglobin is.  We can worry about getting a Port-A-Cath and as an out patient.  I will have to talk to her regarding  the use of chemotherapy.  I need to make sure that she would want to be treated.  Lattie Haw, MD  Darlyn Chamber 29:11

## 2019-06-04 NOTE — Progress Notes (Signed)
Chaplain responded to Spiritual Consult.  Ms. Punam was visiting with her cousin [name].  Initiated a relationship of care and concern.  Ms. Dellanira shared she is full of unknowing, full of surprise as how her life could have been ok one day and then not ok the next.  The ups and downs of her condition are confusing to her.  On Monday she believed she was a candidate for surgery, but physician told her today she isn't.  She's not quite sure why and unsure what the plan is her physician is working on now.  But through it all Ms. Devonna's faith is guiding her.  She is accepting and hopeful for a recovery or for a reunion with her family in heaven if her time here on earth is over.  Chaplain acknowledged her faith and confirmed how it is the one true support she has.  Ms. Kendrea asked for prayers of strength, of knowing, of discerning and that the Reita Cliche will take her quickly if there is no hope for a cure.  Her prayer was not for relief of her own suffering; rather that her family will be spared the suffering of watching her waste away and grieve for a long period of time.  Having lost two brothers, Ms. Laiyah knows what that's like and doesn't want it for her family.  Chaplain confirmed Ms. Lucianne for having been a witness to her today. 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  De Burrs Chaplain Resident

## 2019-06-04 NOTE — Plan of Care (Addendum)
  Problem: Education: Goal: Knowledge of General Education information will improve Description: Including pain rating scale, medication(s)/side effects and non-pharmacologic comfort measures Outcome: Progressing   Problem: Health Behavior/Discharge Planning: Goal: Ability to manage health-related needs will improve Outcome: Progressing   Problem: Clinical Measurements: Goal: Will remain free from infection Outcome: Progressing Goal: Diagnostic test results will improve Outcome: Progressing Goal: Respiratory complications will improve Outcome: Progressing Goal: Cardiovascular complication will be avoided Outcome: Progressing   Problem: Coping: Goal: Level of anxiety will decrease Outcome: Progressing   Problem: Elimination: Goal: Will not experience complications related to bowel motility Outcome: Progressing Goal: Will not experience complications related to urinary retention Outcome: Progressing   Problem: Pain Managment: Goal: General experience of comfort will improve Outcome: Progressing   Problem: Skin Integrity: Goal: Risk for impaired skin integrity will decrease Outcome: Progressing

## 2019-06-05 DIAGNOSIS — R748 Abnormal levels of other serum enzymes: Secondary | ICD-10-CM

## 2019-06-05 DIAGNOSIS — C787 Secondary malignant neoplasm of liver and intrahepatic bile duct: Secondary | ICD-10-CM

## 2019-06-05 DIAGNOSIS — K838 Other specified diseases of biliary tract: Secondary | ICD-10-CM

## 2019-06-05 DIAGNOSIS — Z515 Encounter for palliative care: Secondary | ICD-10-CM

## 2019-06-05 DIAGNOSIS — C259 Malignant neoplasm of pancreas, unspecified: Secondary | ICD-10-CM

## 2019-06-05 LAB — GLUCOSE, CAPILLARY
Glucose-Capillary: 113 mg/dL — ABNORMAL HIGH (ref 70–99)
Glucose-Capillary: 113 mg/dL — ABNORMAL HIGH (ref 70–99)
Glucose-Capillary: 128 mg/dL — ABNORMAL HIGH (ref 70–99)
Glucose-Capillary: 131 mg/dL — ABNORMAL HIGH (ref 70–99)
Glucose-Capillary: 99 mg/dL (ref 70–99)

## 2019-06-05 LAB — CBC WITH DIFFERENTIAL/PLATELET
Abs Immature Granulocytes: 0.02 10*3/uL (ref 0.00–0.07)
Basophils Absolute: 0 10*3/uL (ref 0.0–0.1)
Basophils Relative: 0 %
Eosinophils Absolute: 0.3 10*3/uL (ref 0.0–0.5)
Eosinophils Relative: 4 %
HCT: 28.4 % — ABNORMAL LOW (ref 36.0–46.0)
Hemoglobin: 8.7 g/dL — ABNORMAL LOW (ref 12.0–15.0)
Immature Granulocytes: 0 %
Lymphocytes Relative: 11 %
Lymphs Abs: 0.7 10*3/uL (ref 0.7–4.0)
MCH: 30.5 pg (ref 26.0–34.0)
MCHC: 30.6 g/dL (ref 30.0–36.0)
MCV: 99.6 fL (ref 80.0–100.0)
Monocytes Absolute: 1 10*3/uL (ref 0.1–1.0)
Monocytes Relative: 16 %
Neutro Abs: 4.3 10*3/uL (ref 1.7–7.7)
Neutrophils Relative %: 69 %
Platelets: 292 10*3/uL (ref 150–400)
RBC: 2.85 MIL/uL — ABNORMAL LOW (ref 3.87–5.11)
RDW: 16.3 % — ABNORMAL HIGH (ref 11.5–15.5)
WBC: 6.2 10*3/uL (ref 4.0–10.5)
nRBC: 0 % (ref 0.0–0.2)

## 2019-06-05 LAB — COMPREHENSIVE METABOLIC PANEL
ALT: 183 U/L — ABNORMAL HIGH (ref 0–44)
AST: 155 U/L — ABNORMAL HIGH (ref 15–41)
Albumin: 2 g/dL — ABNORMAL LOW (ref 3.5–5.0)
Alkaline Phosphatase: 460 U/L — ABNORMAL HIGH (ref 38–126)
Anion gap: 7 (ref 5–15)
BUN: <5 mg/dL — ABNORMAL LOW (ref 8–23)
CO2: 23 mmol/L (ref 22–32)
Calcium: 8.3 mg/dL — ABNORMAL LOW (ref 8.9–10.3)
Chloride: 106 mmol/L (ref 98–111)
Creatinine, Ser: 0.34 mg/dL — ABNORMAL LOW (ref 0.44–1.00)
GFR calc Af Amer: >60 mL/min (ref >60)
GFR calc non Af Amer: >60 mL/min (ref >60)
Glucose, Bld: 102 mg/dL — ABNORMAL HIGH (ref 70–99)
Potassium: 4.3 mmol/L (ref 3.5–5.1)
Sodium: 136 mmol/L (ref 135–145)
Total Bilirubin: 2.5 mg/dL — ABNORMAL HIGH (ref 0.3–1.2)
Total Protein: 4.4 g/dL — ABNORMAL LOW (ref 6.5–8.1)

## 2019-06-05 LAB — MAGNESIUM: Magnesium: 1.8 mg/dL (ref 1.7–2.4)

## 2019-06-05 MED ORDER — BASAGLAR KWIKPEN 100 UNIT/ML ~~LOC~~ SOPN: 8.0000 [IU] | PEN_INJECTOR | Freq: Every day | SUBCUTANEOUS | Status: AC

## 2019-06-05 MED ORDER — SODIUM CHLORIDE 0.9 % IV SOLN
510.0000 mg | Freq: Once | INTRAVENOUS | Status: AC
  Administered 2019-06-05: 510 mg via INTRAVENOUS
  Filled 2019-06-05: qty 17

## 2019-06-05 MED ORDER — POLYETHYLENE GLYCOL 3350 17 G PO PACK: 17.0000 g | PACK | Freq: Every day | ORAL | 0 refills | Status: AC | PRN

## 2019-06-05 MED ORDER — MORPHINE SULFATE (CONCENTRATE) 10 MG/0.5ML PO SOLN: 10.0000 mg | ORAL | Status: DC | PRN

## 2019-06-05 MED ORDER — FENTANYL 12 MCG/HR TD PT72: 1.0000 | MEDICATED_PATCH | TRANSDERMAL | 0 refills | Status: AC

## 2019-06-05 MED ORDER — FENTANYL 25 MCG/HR TD PT72: 1.0000 | MEDICATED_PATCH | TRANSDERMAL | Status: DC

## 2019-06-05 MED ORDER — PANTOPRAZOLE SODIUM 40 MG PO TBEC: 40.0000 mg | DELAYED_RELEASE_TABLET | Freq: Every day | ORAL | 0 refills | Status: AC

## 2019-06-05 MED ORDER — ONDANSETRON HCL 4 MG PO TABS: 4.0000 mg | ORAL_TABLET | Freq: Four times a day (QID) | ORAL | 0 refills | Status: AC | PRN

## 2019-06-05 MED ORDER — PROMETHAZINE HCL 25 MG/ML IJ SOLN
6.2500 mg | Freq: Four times a day (QID) | INTRAMUSCULAR | Status: DC | PRN
  Administered 2019-06-05: 6.25 mg via INTRAVENOUS
  Filled 2019-06-05: qty 1

## 2019-06-05 MED ORDER — HYDROMORPHONE HCL 1 MG/ML IJ SOLN
0.2500 mg | INTRAMUSCULAR | Status: DC | PRN
  Administered 2019-06-05: 0.25 mg via INTRAVENOUS
  Filled 2019-06-05: qty 1

## 2019-06-05 MED ORDER — MORPHINE SULFATE (CONCENTRATE) 10 MG/0.5ML PO SOLN
10.0000 mg | ORAL | Status: DC | PRN
  Administered 2019-06-05: 10 mg via ORAL
  Filled 2019-06-05: qty 5

## 2019-06-05 MED ORDER — APIXABAN 5 MG PO TABS: 5.0000 mg | ORAL_TABLET | Freq: Two times a day (BID) | ORAL | 0 refills | Status: AC

## 2019-06-05 MED ORDER — FENTANYL 12 MCG/HR TD PT72
1.0000 | MEDICATED_PATCH | TRANSDERMAL | Status: DC
  Administered 2019-06-05: 1 via TRANSDERMAL
  Filled 2019-06-05: qty 1

## 2019-06-05 MED ORDER — MORPHINE SULFATE (CONCENTRATE) 10 MG/0.5ML PO SOLN: 10.0000 mg | ORAL | 0 refills | Status: AC | PRN

## 2019-06-05 MED ORDER — SENNOSIDES-DOCUSATE SODIUM 8.6-50 MG PO TABS: 1.0000 | ORAL_TABLET | Freq: Two times a day (BID) | ORAL | 0 refills | Status: AC

## 2019-06-05 MED ORDER — POLYETHYLENE GLYCOL 3350 17 G PO PACK: 17.0000 g | PACK | Freq: Every day | ORAL | Status: DC | PRN

## 2019-06-05 MED ORDER — PROMETHAZINE HCL 12.5 MG PO TABS: 12.5000 mg | ORAL_TABLET | Freq: Four times a day (QID) | ORAL | 0 refills | Status: AC | PRN

## 2019-06-05 MED ORDER — OXYCODONE HCL 5 MG PO TABS: 5.0000 mg | ORAL_TABLET | Freq: Four times a day (QID) | ORAL | 0 refills | Status: DC | PRN

## 2019-06-05 NOTE — Progress Notes (Signed)
I had a very long talk with Tracy Odonnell this morning.  Her cousin, who will be taking care of her was with [place].  I think it is apparent that Tracy Odonnell has very extensive pancreatic cancer.  I think that the CA 19-9 of 28,000 shows this this.  I think what is even more prognostic is the fact that her prealbumin is only 7.5.  I really do not believe that Tracy Odonnell is going to benefit from systemic chemotherapy.  I have never seen a patient with a prealbumin less than 10 and having metastatic cancer, live more than 6-8 weeks.  It is apparent that quality of life is are primary goal.  I know Tracy Odonnell would like to have some quality of life for the remainder of time that she has.  I asked her if she wanted to know how long I thought that she had.  She wanted to know.  I was honest with her and told her that I had did not think that she was going to make it to [place] day weekend in May.  She was really not surprised by this.  She said that when her mother was diagnosed with pancreatic cancer, she lived about 5-6 weeks.  We did talk about end-of-life issues.  We talked about being kept alive on life support.  Tracy Odonnell DOES NOT want to have this happen.  I totally agree with this.  I think that if she were to go on life support, she would never come off and will be up to her family to turn off the machine.  She DOES NOT want this to happen.  As such, she is a DO NOT RESUSCITATE.  I believe that she will definitely benefit from Hospice seeing her at home.  We really need to make sure that [place] will be aware of her discharge so they can see her this weekend when she is discharged.  She is agreeable to Hospice following her at home.  I told her that if there are any issues with her at home, that [facility] has a wonderful hospice facility that she can go to.  I think pain issues are going to be paramount.  She is not can be able to digest well.  I think she will benefit from a  Duragesic patch.  25 mcg patch I think would be reasonable for her.  I also believe that Roxanol concentrated liquid pain medicine would also be beneficial for so that she could absorb this.  I suspect that she likely has tumor "coating" her intestines.  Because of this, I just do not think she is going to be able to digest well.  I just think that her intestines are not going to really move all that well.  She definitely needs to be on a good bowel regimen.  I really do not believe that she needs to be on blood thinner.  I do still see how blood thinner is going to impact her quality of life in a positive way.  I also do not think that diabetes is going to be much of an issue.  I think if her blood sugars run in the 200-300 range, this will be fine and she will not need supplemental insulin.  I spent about an hour with Tracy Odonnell and her cousin.  I answered all their questions.  Tracy Odonnell clearly is not afraid to pass on.  She just wants to make sure  that she is comfortable and that her passing will be peaceful.  I think that Hospice will really help toward that goal.  I do think that a dose of IV iron would not be a bad idea for her.  She is quite anemic.  She is quite iron deficient.  I do think a dose of iron would be a bad idea.  At the end of our meeting, we had a very good prayer session.  Again, Tracy Odonnell has an incredible faith.  She knows that she will be with Jesus up in heaven and that she is looking forward to this.   Lattie Haw, MD  2 timothy 4:16-18

## 2019-06-05 NOTE — TOC Progression Note (Addendum)
Transition of Care [facility]) - Progression Note    Patient Details  Name: Tracy Odonnell MRN: [id number] Date of Birth: [birth year]  Transition of Care [facility]) CM/SW [place], [place] Phone Number: 06/05/2019, 2:04 PM  Clinical Narrative:    CSW spoke with Fraser Din with [facility] for Palliative consult for pt. Case Mgr. Caryl Pina initated consult for pt.  [place] will meet with pt on Sunday at 10:00am at the home.   Expected Discharge Plan: [place] Barriers to Discharge: Continued Medical Work up  Expected Discharge Plan and Services Expected Discharge Plan: [place]   Discharge Planning Services: CM Consult   Living arrangements for the past 2 months: Single Family Home Expected Discharge Date: 06/05/19               DME Arranged: N/A         HH Arranged: PT           Social Determinants of Health (SDOH) Interventions    Readmission Risk Interventions No flowsheet data found.

## 2019-06-05 NOTE — Care Management (Signed)
Notified [facility] that patient will DC to home today

## 2019-06-05 NOTE — Discharge Summary (Addendum)
Physician Discharge Summary  Tracy Odonnell [id number] DOB: 1940/11/13 DOA: 05/29/2019  PCP: Lavone Orn, MD  Admit date: 05/29/2019 Discharge date: 06/05/2019  Admitted From: Home Disposition: Home  Recommendations for Outpatient Follow-up:  1. Follow up with PCP in 1 week with repeat CBC/CMP 2. Outpatient follow-up with oncology/Dr. Marin Olp 3. Patient will benefit from outpatient evaluation and follow-up by palliative care as well 4. Outpatient follow-up with GI if needed 5. Follow up in ED if symptoms worsen or new appear   Home Health: Home with PT/RN Equipment/Devices: None  Discharge Condition: Poor CODE STATUS: DNR  diet recommendation: Comfort foods  Brief/Interim Summary: 79 year old female with history of diabetes mellitus type 2, seizure, uterine cancer status post hysterectomy, obesity presented with right upper quadrant pain and vomiting.  She was found to have obstructive jaundice with elevated LFTs.  GI was consulted and patient underwent ERCP and stent placement along with EUS on 06/01/2019.  FNAC was consistent with adenocarcinoma.  Oncology was consulted.  General surgery was continued but patient is not a surgical candidate and not a candidate for IR guided biopsy of the peritoneal/omental nodules.  Palliative care is also following the patient.  Patient is not a candidate for chemotherapy as per oncology.  She has agreed for home hospice.  Discharge patient home with hospice once arrangements have been made.  Discharge Diagnoses:   Obstructive jaundice with elevated LFTs/new diagnosis of pancreatic head adenocarcinoma --CT abdomen pelvis showed intrahepatic bile duct dilation extending into the common hepatic duct -MRCP showed severe intra and extrahepatic biliary ductal dilatation to the pancreatic head, with severe dilatation of the pancreatic duct in the pancreatic neck and more distally. Constellation of findings suspicious for pancreatic adenocarcinoma. -Status  post ERCP and stent placement in CBD along with EUS on 06/01/2019.  FNAC consistent with adenocarcinoma.   -Oral intake is poor but improving.  -Antibiotics discontinued on 06/02/2019 -Oncology following and oncology thinks that patient is not a candidate for chemotherapy and recommend home hospice.  Palliative care following.  Patient has agreed for home hospice. Patient is not a surgical candidate and also not a candidate for IR guided biopsy of the peritoneal/omental nodules. -LFTs improving.  Follow-up of LFTs and an outpatient. -Discharge home with hospice.  Diabetes mellitus type 2 -A1c 7.7.  Continue reduced dose of Lantus.  Carb modified diet.  Outpatient follow-up  Hypertension  -Monitor blood pressure.  Outpatient follow-up  Seizure disorder -Continue Keppra  Bilateral lower extremity DVT --Lower extremity Doppler showed L age-indeterminate DVT of posterior tibial, peroneal veins. Right no evidence of DVT. -Doppler on 05/16/2019 showed acute DVT of the right and left posterior tibial veins-patient was placed on Eliquis -Recently, patient discontinued Eliquis at the recommendation of her PCP, unsure as to why. -Eliquis has been restarted.  Outpatient follow-up with PCP.  Hypomagnesemia -Improved.  Mechanical fall -CT head unremarkable for acute finding -Continue fall precautions -PT recommended home health PT.  Chronic normocytic anemia -Hemoglobin stable.      Hypokalemia -Improved.  Discharge Instructions  Discharge Instructions    Amb Referral to Palliative Care   Complete by: As directed    Diet - low sodium heart healthy   Complete by: As directed    Diet Carb Modified   Complete by: As directed    Increase activity slowly   Complete by: As directed      Allergies as of 06/05/2019      Reactions   Influenza Vaccines Other (See Comments)   PT  STATES SHE GETS THE FLU FROM THE VACCINE      Medication List    TAKE these medications   apixaban 5 MG  Tabs tablet Commonly known as: ELIQUIS Take 1 tablet (5 mg total) by mouth 2 (two) times daily.   Basaglar KwikPen 100 UNIT/ML Inject 0.08 mLs (8 Units total) into the skin daily before breakfast. What changed: how much to take   fentaNYL 12 MCG/HR Commonly known as: [place]iles 1 patch onto the skin every 3 (three) days. Start taking on: June 08, 2019   furosemide 40 MG tablet Commonly known as: LASIX Take 40 mg by mouth daily as needed for fluid or edema.   levETIRAcetam 500 MG tablet Commonly known as: KEPPRA Take 500 mg by mouth 2 (two) times daily.   morphine CONCENTRATE 10 MG/0.5ML Soln concentrated solution Take 0.5 mLs (10 mg total) by mouth every 2 (two) hours as needed for moderate pain (shortness of breath).   ondansetron 4 MG tablet Commonly known as: ZOFRAN Take 1 tablet (4 mg total) by mouth every 6 (six) hours as needed for nausea.   pantoprazole 40 MG tablet Commonly known as: PROTONIX Take 1 tablet (40 mg total) by mouth daily.   polyethylene glycol 17 g packet Commonly known as: MIRALAX / GLYCOLAX Take 17 g by mouth daily as needed for moderate constipation.   promethazine 12.5 MG tablet Commonly known as: PHENERGAN Take 1 tablet (12.5 mg total) by mouth every 6 (six) hours as needed for nausea or vomiting.   senna-docusate 8.6-50 MG tablet Commonly known as: Senokot-S Take 1 tablet by mouth 2 (two) times daily.       Follow-up Information    [facility] Follow up.   Specialty: Home Health Services Why: home health PT  Contact information: [address] [address] [phone]        Lavone Orn, MD. Schedule an appointment as soon as possible for a visit in 1 week(s).   Specialty: Internal Medicine Why: with repeat cbc/cmp Contact information: [address] [place] [address] [phone]        Volanda Napoleon, MD Follow up.   Specialty: Oncology Why: At earliest  convenience Contact information: [address] [place] [address] [phone]          Allergies  Allergen Reactions  . Influenza Vaccines Other (See Comments)    PT STATES SHE GETS THE FLU FROM THE VACCINE    Consultations:  GI/oncology/palliative care/general surgery   Procedures/Studies: DG Abd 1 View  Result Date: 05/31/2019 CLINICAL DATA:  Recent bowel obstruction.  Nausea. EXAM: ABDOMEN - 1 VIEW COMPARISON:  May 17, 2019 FINDINGS: There is moderate stool in the colon. There is no bowel dilatation or air-fluid level to suggest bowel obstruction. No free air. There are surgical clips in the right upper quadrant. There is degenerative change in the lumbar spine. IMPRESSION: No bowel obstruction or free air.  Moderate stool in colon. Electronically Signed   By: Lowella Grip III M.D.   On: 05/31/2019 12:48   CT Head Wo Contrast  Result Date: 05/29/2019 CLINICAL DATA:  Pain following fall EXAM: CT HEAD WITHOUT CONTRAST TECHNIQUE: Contiguous axial images were obtained from the base of the skull through the vertex without intravenous contrast. COMPARISON:  February 25, 2017 FINDINGS: Brain: Ventricles and sulci are normal in size and configuration. There is no intracranial mass, hemorrhage, extra-axial fluid collection, or midline  shift. There is mild small vessel disease in the centra semiovale, stable. No acute infarct evident. Vascular: No hyperdense vessel. There is calcification in each carotid siphon region. Skull: Bony calvarium a appears intact. Sinuses/Orbits: There is mucosal thickening in several ethmoid air cells. Other visualized paranasal sinuses are clear. Orbits appear symmetric bilaterally. Other: Mastoid air cells are clear. IMPRESSION: Mild periventricular small vessel disease. No acute infarct. No mass or hemorrhage. There are foci of arterial vascular calcification. Mucosal thickening noted in several ethmoid air cells. Electronically Signed    By: Lowella Grip III M.D.   On: 05/29/2019 15:07   CT Abdomen Pelvis W Contrast  Result Date: 05/29/2019 CLINICAL DATA:  Nausea vomiting. Reported history of a bowel obstruction. Mechanical fall last night. EXAM: CT ABDOMEN AND PELVIS WITH CONTRAST TECHNIQUE: Multidetector CT imaging of the abdomen and pelvis was performed using the standard protocol following bolus administration of intravenous contrast. CONTRAST:  167mL OMNIPAQUE IOHEXOL 300 MG/ML  SOLN COMPARISON:  CT, 05/07/2019 FINDINGS: Lower chest: No acute abnormality. Hepatobiliary: Liver normal in size. No mass or focal lesion. There is significant intrahepatic bile duct dilation. Common hepatic duct measures 13 mm in diameter. Common bile duct appears normal in caliber. Duct dilation was present on the prior CT, but appears increased. Status post cholecystectomy. Pancreas: Atrophy with irregular dilation of the pancreatic duct, from the body through the tail, but no defined mass and no inflammation. Spleen: Normal in size without focal abnormality. Adrenals/Urinary Tract: Adrenal glands are unremarkable. Kidneys are normal, without renal calculi, focal lesion, or hydronephrosis. Bladder is unremarkable. Stomach/Bowel: Normal stomach. Small bowel and colon are normal in caliber. No wall thickening. No inflammation. There are diverticula most evident along the sigmoid colon. No diverticulitis. No evidence of appendicitis Vascular/Lymphatic: Mild aortic atherosclerosis. No aneurysm. No enlarged lymph nodes. Reproductive: Status post hysterectomy. No adnexal masses. Other: Midline anterior abdominal wall incision. No hernia. Trace fluid adjacent of the liver. Musculoskeletal: No fracture or acute finding. No osteoblastic or osteolytic lesions. IMPRESSION: 1. Intrahepatic bile duct dilation extending to the common hepatic duct, increased in severity compared to the prior CT. Etiology of this is unclear. No pancreatic mass. No visualized duct stone.  Given the low sensitivity of CT for choledocholithiasis, recommend follow-up MRCP or ERCP. Trace ascites adjacent to the liver. 2. No other evidence of an acute or recent abnormality. No evidence of bowel obstruction or inflammation. Electronically Signed   By: Lajean Manes M.D.   On: 05/29/2019 15:12   DG ERCP BILIARY & PANCREATIC DUCTS  Result Date: 06/01/2019 CLINICAL DATA:  Biliary dilatation to the pancreatic head with associated pancreatic duct dilatation. Concern for pancreatic head mass. EXAM: ERCP with FNA biopsy and CBD stent TECHNIQUE: Multiple spot images obtained with the fluoroscopic device and submitted for interpretation post-procedure. FLUOROSCOPY TIME:  Fluoroscopy Time:  5 minutes 9 seconds Radiation Exposure Index (if provided by the fluoroscopic device): 101 Number of Acquired Spot Images: 7 COMPARISON:  05/29/2019, 05/30/2019 FINDINGS: Spot fluoroscopic views during the ERCP procedure demonstrate previous cholecystectomy. Marked biliary dilatation. CBD expandable stent inserted. Please refer to the ERCP procedure note for further details of the intervention. IMPRESSION: Biliary dilatation correlates with the CT and MRI. Endoscopic distal CBD wall flex stent inserted. These images were submitted for radiologic interpretation only. Please see the procedural report for the amount of contrast and the fluoroscopy time utilized. Electronically Signed   By: Jerilynn Mages.  Shick M.D.   On: 06/01/2019 13:42   MR ABDOMEN MRCP W  WO CONTAST  Result Date: 05/30/2019 CLINICAL DATA:  Jaundice, weight loss, biliary ductal dilatation by CT, history of bowel obstruction EXAM: MRI ABDOMEN WITHOUT AND WITH CONTRAST (INCLUDING MRCP) TECHNIQUE: Multiplanar multisequence MR imaging of the abdomen was performed both before and after the administration of intravenous contrast. Heavily T2-weighted images of the biliary and pancreatic ducts were obtained, and three-dimensional MRCP images were rendered by post processing.  CONTRAST:  68mL GADAVIST GADOBUTROL 1 MMOL/ML IV SOLN COMPARISON:  CT abdomen pelvis, 05/29/2019, 05/07/2019 FINDINGS: Lower chest: No acute findings. Hepatobiliary: No mass or other parenchymal abnormality identified. There is extensive intrahepatic and extrahepatic biliary ductal dilatation, as seen on prior CT dated 05/29/2019 and significantly increased comparison to CT dated 05/07/2019, measuring up to 1.2 cm in caliber. MRCP of the central portion of the common bile duct is limited by metallic susceptibility artifact from cholecystectomy clips. Pancreas: There is severe dilatation of the pancreatic duct in the pancreatic neck and more distally, measuring up to 1.0 cm in caliber. There is no definitely visualized pancreatic mass, however there appears to be a very subtle, ill-defined lesion of the pancreatic head with subtle soft tissue stranding about the adjacent duodenum, portal vein, superior mesenteric vein, and superior mesenteric artery (e.g. Series 4, image 17, series 34, image 47). This measures no greater than 2.5 cm. Spleen:  Within normal limits in size and appearance. Adrenals/Urinary Tract: No masses identified. No evidence of hydronephrosis. Stomach/Bowel: Visualized portions within the abdomen are unremarkable. Vascular/Lymphatic: No pathologically enlarged lymph nodes identified. No abdominal aortic aneurysm demonstrated. Other: Trace perihepatic ascites. Small omental and peritoneal nodules (series 32, image 58, 49). Musculoskeletal: No suspicious bone lesions identified. IMPRESSION: 1. 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 There appears to be a very subtle, ill-defined lesion in the pancreatic head with soft tissue stranding about the adjacent duodenum, portal vein, superior mesenteric vein, and superior mesenteric artery measuring no greater than 2.5 cm. This  constellation of findings is highly suspicious for pancreatic adenocarcinoma. Consider ERCP for tissue sampling. 2. Trace perihepatic ascites with small omental and peritoneal nodules, highly suspicious for peritoneal metastatic disease. Electronically Signed   By: Eddie Candle M.D.   On: 05/30/2019 10:19   VAS [place] LOWER EXTREMITY VENOUS (DVT)  Result Date: 05/31/2019  Lower Venous DVT Study Indications: Swelling.  Risk Factors: Recently diagnosed with bilateral lower extremity calf DVT at [facility], patient only took Eliquis for approximately 1 week before discontinuing medicine herself. Limitations: Patient's pain and holding of breath with compression,. Performing Technologist: Sharion Dove RVS  Examination Guidelines: A complete evaluation includes B-mode imaging, spectral Doppler, color Doppler, and power Doppler as needed of all accessible portions of each vessel. Bilateral testing is considered an integral part of a complete examination. Limited examinations for reoccurring indications may be performed as noted. The reflux portion of the exam is performed with the patient in reverse Trendelenburg.  +---------+---------------+---------+-----------+----------+--------------+ RIGHT    CompressibilityPhasicitySpontaneityPropertiesThrombus Aging +---------+---------------+---------+-----------+----------+--------------+ CFV      Full           Yes      Yes                                 +---------+---------------+---------+-----------+----------+--------------+ SFJ      Full                                                        +---------+---------------+---------+-----------+----------+--------------+  FV Prox  Full                                                        +---------+---------------+---------+-----------+----------+--------------+ FV Mid   Full                                                         +---------+---------------+---------+-----------+----------+--------------+ FV DistalFull                                                        +---------+---------------+---------+-----------+----------+--------------+ PFV      Full                                                        +---------+---------------+---------+-----------+----------+--------------+ POP      Full           Yes      Yes                                 +---------+---------------+---------+-----------+----------+--------------+ PTV      Full                                                        +---------+---------------+---------+-----------+----------+--------------+ PERO     Full                                                        +---------+---------------+---------+-----------+----------+--------------+   +---------+---------------+---------+-----------+----------+-----------------+ LEFT     CompressibilityPhasicitySpontaneityPropertiesThrombus Aging    +---------+---------------+---------+-----------+----------+-----------------+ CFV      Full           Yes      Yes                                    +---------+---------------+---------+-----------+----------+-----------------+ SFJ      Full                                                           +---------+---------------+---------+-----------+----------+-----------------+ FV Prox  Full                                                           +---------+---------------+---------+-----------+----------+-----------------+  FV Mid   Full                                                           +---------+---------------+---------+-----------+----------+-----------------+ FV DistalFull                                                           +---------+---------------+---------+-----------+----------+-----------------+ PFV      Full                                                            +---------+---------------+---------+-----------+----------+-----------------+ POP      Full           Yes      Yes                                    +---------+---------------+---------+-----------+----------+-----------------+ PTV      Partial                                      Age Indeterminate +---------+---------------+---------+-----------+----------+-----------------+ PERO     Partial                                      Age Indeterminate +---------+---------------+---------+-----------+----------+-----------------+     Summary: RIGHT: - There is no evidence of deep vein thrombosis in the lower extremity.  LEFT: - Findings consistent with age indeterminate deep vein thrombosis involving the left posterior tibial veins, and left peroneal veins.  *See table(s) above for measurements and observations. Electronically signed by Monica Martinez MD on 05/31/2019 at 5:24:48 PM.    Final        Subjective: Patient seen and examined at bedside.  States that she is tolerating diet although appetite is still not that great yet.  Had bowel movement yesterday.  No current vomiting.  Intermittently nauseous.  Discharge Exam: Vitals:   06/04/19 2051 06/05/19 0410  BP: (!) 115/47 (!) 136/58  Pulse: 76 75  Resp:  17  Temp: 97.6 F (36.4 C) 98.6 F (37 C)  SpO2: 98% 96%    General: Pt is alert, awake, not in acute distress.  Poor historian; does not participate in conversation much Cardiovascular: rate controlled, S1/S2 + Respiratory: bilateral decreased breath sounds at bases Abdominal: Soft, NT, ND, bowel sounds + Extremities: Trace lower extremity edema present, no cyanosis    The results of significant diagnostics from this hospitalization (including imaging, microbiology, ancillary and laboratory) are listed below for reference.     Microbiology: Recent Results (from the past 240 hour(s))  SARS CORONAVIRUS 2 (TAT 6-24 HRS) Nasopharyngeal Nasopharyngeal Swab      Status: None   Collection Time: 05/29/19  4:39 PM   Specimen: Nasopharyngeal Swab  Result Value Ref Range Status   SARS Coronavirus 2 NEGATIVE NEGATIVE Final    Comment: (NOTE) SARS-CoV-2 target nucleic acids are NOT DETECTED. The SARS-CoV-2 RNA is generally detectable in upper and lower respiratory specimens during the acute phase of infection. Negative results do not preclude SARS-CoV-2 infection, do not rule out co-infections with other pathogens, and should not be used as the sole basis for treatment or other patient management decisions. Negative results must be combined with clinical observations, patient history, and epidemiological information. The expected result is Negative. Fact Sheet for Patients: SugarRoll.be Fact Sheet for Healthcare Providers: https://www.woods-mathews.com/ This test is not yet approved or cleared by the [place] FDA and  has been authorized for detection and/or diagnosis of SARS-CoV-2 by FDA under an Emergency Use Authorization (EUA). This EUA will remain  in effect (meaning this test can be used) for the duration of the COVID-19 declaration under Section 56 4(b)(1) of the Act, 21 U.S.C. section 360bbb-3(b)(1), unless the authorization is terminated or revoked sooner. Performed at [facility], [place] [address], [place], [place] [address]   Culture, blood (routine x 2)     Status: None   Collection Time: 05/29/19  4:48 PM   Specimen: BLOOD  Result Value Ref Range Status   Specimen Description BLOOD RIGHT ARM  Final   Special Requests   Final    BOTTLES DRAWN AEROBIC AND ANAEROBIC Blood Culture adequate volume   Culture   Final    NO GROWTH 5 DAYS Performed at [facility], [place] [address], [place], [place] [address]    Report Status 06/03/2019 FINAL  Final  Culture, blood (routine x 2)     Status: None   Collection Time: 05/29/19  6:23 PM   Specimen: BLOOD  Result Value Ref Range Status    Specimen Description BLOOD RIGHT ARM  Final   Special Requests   Final    BOTTLES DRAWN AEROBIC ONLY Blood Culture adequate volume   Culture   Final    NO GROWTH 5 DAYS Performed at [facility], [place] [address], [place], [place] [address]    Report Status 06/03/2019 FINAL  Final     Labs: BNP (last 3 results) No results for input(s): BNP in the last 8760 hours. Basic Metabolic Panel: Recent Labs  Lab 05/29/19 1648 05/30/19 0939 05/31/19 0411 05/31/19 0656 06/01/19 0417 06/02/19 1125 06/03/19 0434 06/04/19 1200 06/05/19 0902  NA  --    < >   < >  --  139 138 137 134* 136  K  --    < >   < >  --  3.1* 3.4* 3.2* 4.2 4.3  CL  --    < >   < >  --  103 104 105 104 106  CO2  --    < >   < >  --  25 25 25 24 23   GLUCOSE  --    < >   < >  --  89 178* 104* 149* 102*  BUN  --    < >   < >  --  7* 12 8 5* <5*  CREATININE  --    < >   < >  --  0.40* 0.55 0.41* 0.35* 0.34*  CALCIUM  --    < >   < >  --  8.5* 8.1* 8.2* 8.3* 8.3*  MG 2.0  --   --  1.8  --  1.7  1.6*  --  1.8   < > = values in this interval not displayed.   Liver Function Tests: Recent Labs  Lab 06/01/19 0417 06/02/19 1125 06/03/19 0434 06/04/19 1200 06/05/19 0902  AST 162* 184* 98* 146* 155*  ALT 249* 276* 209* 191* 183*  ALKPHOS 417* 488* 373* 458* 460*  BILITOT 1.8* 1.5* 1.3* 1.8* 2.5*  PROT 4.9* 4.6* 4.5* 4.7* 4.4*  ALBUMIN 2.2* 2.2* 2.1* 2.1* 2.0*   Recent Labs  Lab 05/29/19 1238  LIPASE 40   Recent Labs  Lab 05/29/19 1238  AMMONIA 36*   CBC: Recent Labs  Lab 05/29/19 1238 05/30/19 0939 06/02/19 0154 06/02/19 1125 06/03/19 0434 06/04/19 1200 06/05/19 0902  WBC 10.6*   < > 8.2 7.3 7.6 8.2 6.2  NEUTROABS 9.7*  --   --  6.0  --  6.6 4.3  HGB 11.8*   < > 9.8* 9.1* 8.5* 9.0* 8.7*  HCT 36.6   < > 30.8* 29.0* 27.2* 28.3* 28.4*  MCV 95.1   < > 99.0 98.6 98.6 99.6 99.6  PLT 372   < > 352 343 281 292 292   < > = values in this interval not displayed.   Cardiac Enzymes: No results for  input(s): CKTOTAL, CKMB, CKMBINDEX, TROPONINI in the last 168 hours. BNP: Invalid input(s): POCBNP CBG: Recent Labs  Lab 06/04/19 1627 06/04/19 2048 06/05/19 0011 06/05/19 0407 06/05/19 0740  GLUCAP 113* 149* 128* 113* 99   D-Dimer No results for input(s): DDIMER in the last 72 hours. Hgb A1c No results for input(s): HGBA1C in the last 72 hours. Lipid Profile No results for input(s): CHOL, HDL, LDLCALC, TRIG, CHOLHDL, LDLDIRECT in the last 72 hours. Thyroid function studies No results for input(s): TSH, T4TOTAL, T3FREE, THYROIDAB in the last 72 hours.  Invalid input(s): FREET3 Anemia work up Recent Labs    06/04/19 1200  FERRITIN 65  TIBC 272  IRON 16*   Urinalysis    Component Value Date/Time   COLORURINE AMBER (A) 05/29/2019 2225   APPEARANCEUR CLEAR 05/29/2019 2225   LABSPEC >1.046 (H) 05/29/2019 2225   PHURINE 5.0 05/29/2019 2225   GLUCOSEU >=500 (A) 05/29/2019 2225   HGBUR NEGATIVE 05/29/2019 2225   BILIRUBINUR NEGATIVE 05/29/2019 2225   KETONESUR 80 (A) 05/29/2019 2225   PROTEINUR NEGATIVE 05/29/2019 2225   NITRITE NEGATIVE 05/29/2019 2225   LEUKOCYTESUR TRACE (A) 05/29/2019 2225   Sepsis Labs Invalid input(s): PROCALCITONIN,  WBC,  LACTICIDVEN Microbiology Recent Results (from the past 240 hour(s))  SARS CORONAVIRUS 2 (TAT 6-24 HRS) Nasopharyngeal Nasopharyngeal Swab     Status: None   Collection Time: 05/29/19  4:39 PM   Specimen: Nasopharyngeal Swab  Result Value Ref Range Status   SARS Coronavirus 2 NEGATIVE NEGATIVE Final    Comment: (NOTE) SARS-CoV-2 target nucleic acids are NOT DETECTED. The SARS-CoV-2 RNA is generally detectable in upper and lower respiratory specimens during the acute phase of infection. Negative results do not preclude SARS-CoV-2 infection, do not rule out co-infections with other pathogens, and should not be used as the sole basis for treatment or other patient management decisions. Negative results must be combined with  clinical observations, patient history, and epidemiological information. The expected result is Negative. Fact Sheet for Patients: SugarRoll.be Fact Sheet for Healthcare Providers: https://www.woods-mathews.com/ This test is not yet approved or cleared by the [place] FDA and  has been authorized for detection and/or diagnosis of SARS-CoV-2 by FDA under an Emergency Use Authorization (EUA). This EUA will remain  in effect (meaning this test can be used) for the duration of the COVID-19 declaration under Section 56 4(b)(1) of the Act, 21 U.S.C. section 360bbb-3(b)(1), unless the authorization is terminated or revoked sooner. Performed at [facility], [place] [address], [place], [place] [address]   Culture, blood (routine x 2)     Status: None   Collection Time: 05/29/19  4:48 PM   Specimen: BLOOD  Result Value Ref Range Status   Specimen Description BLOOD RIGHT ARM  Final   Special Requests   Final    BOTTLES DRAWN AEROBIC AND ANAEROBIC Blood Culture adequate volume   Culture   Final    NO GROWTH 5 DAYS Performed at [facility], [place] [address], [place], [place] [address]    Report Status 06/03/2019 FINAL  Final  Culture, blood (routine x 2)     Status: None   Collection Time: 05/29/19  6:23 PM   Specimen: BLOOD  Result Value Ref Range Status   Specimen Description BLOOD RIGHT ARM  Final   Special Requests   Final    BOTTLES DRAWN AEROBIC ONLY Blood Culture adequate volume   Culture   Final    NO GROWTH 5 DAYS Performed at [facility], [place] [address] [place],  [address]    Report Status 06/03/2019 FINAL  Final     Time coordinating discharge: 35 minutes  SIGNED:   Aline August, MD  [facility] 06/05/2019, 10:57 AM

## 2019-06-05 NOTE — Progress Notes (Signed)
Daily Progress Note   Patient Name: Tracy Odonnell       Date: 06/05/2019 DOB: 1940/03/31  Age: 79 y.o. MRN#: [id number] Attending Physician: Aline August, MD Primary Care Physician: Lavone Orn, MD Admit Date: 05/29/2019  Reason for Consultation/Follow-up: Non pain symptom management, Pain control, Psychosocial/spiritual support and Terminal Care, Hospice discussion  Subjective: When I entered the room patient was vomiting.  After she was cared for I listened to what she understood about her prognosis.  We then discussed code status.  She is a DNR.  We then discussed Hospice services at home.  She would like to have [facility].  Finally we talked about her symptoms of pain, difficulty swallowing, vomiting, and nausea.  I advised her about opioid induced constipation and recommended Miralax and Sennakot.    Questions were answered to the best of my ability.   Assessment: Very pleasant female. Metastatic pancreatic cancer.  Understands her prognosis is weeks.   Patient Profile/HPI:  Palliative Care consult requested for goals of care discussion in this 79 y.o. female with multiple medical problems including insulin-dependent diabetes mellitus, seizure, uterine cancer status post hysterectomy, obesity, DVT in right and left posterior tibial vein (05/16/19 Eliquis d/c by PCP), and hypertension. Patient presented to ED with complaints of right upper quadrant pain, fall, and vomiting x 2 days (dark brown/green in color). She recently was hospitalized at [facility] for 10 days and treated for small bowel obstruction, DKA, and DVT. During her ED work-up AST 393, ALT 444, bilirubin 4.1, ammonia 36. Head CT showed no acute abnormalities. CT abdomen showed intrahepatic  bile duct dilation. Since admission patient evaluated by GI and underwent EGD/ERCP with findings of pancreatic head mass consistent with adenocarcinoma. Patient being followed by Oncology (Dr. Marin Olp). Also seen by surgery and deemed not to be a candidate for surgical intervention.     Length of Stay: 7  Current Medications: Scheduled Meds:  . fentaNYL  1 patch Transdermal Q72H  . insulin aspart  0-15 Units Subcutaneous Q4H  . insulin glargine  8 Units Subcutaneous QHS  . naloxegol oxalate  12.5 mg Oral Daily  . pantoprazole  40 mg Oral BID AC  . senna-docusate  1 tablet Oral BID  . sodium chloride flush  10-40 mL Intracatheter Q12H  Continuous Infusions: . sodium chloride 20 mL/hr at 06/05/19 1229  . levETIRAcetam 500 mg (06/05/19 0436)    PRN Meds: morphine CONCENTRATE, ondansetron **OR** ondansetron (ZOFRAN) IV, polyethylene glycol, sodium chloride flush  Physical Exam       Well developed female, nauseated and vomiting when I entered. Alert, orientated with clear speech.  Appropriate.   Once she was able to stop vomiting she was in no acute distress.  Vital Signs: BP (!) 136/58 (BP Location: Right Arm)   Pulse 75   Temp 98.6 F (37 C) (Oral)   Resp 17   Ht 5\' 2"  (1.575 m)   Wt 81.2 kg   LMP  (LMP Unknown)   SpO2 96%   BMI 32.74 kg/m  SpO2: SpO2: 96 % O2 Device: O2 Device: Room Air O2 Flow Rate: O2 Flow Rate (L/min): 2 L/min  Intake/output summary:   Intake/Output Summary (Last 24 hours) at 06/05/2019 1359 Last data filed at 06/05/2019 0500 Gross per 24 hour  Intake 1671.52 ml  Output 200 ml  Net 1471.52 ml   LBM: Last BM Date: 06/04/19 Baseline Weight: Weight: 81.2 kg Most recent weight: Weight: 81.2 kg       Palliative Assessment/Data:  50%      Patient Active Problem List   Diagnosis Date Noted  . Seizures ([place])   . DM (diabetes mellitus) ([place])   . Elevated liver enzymes   . Elevated blood-pressure reading without diagnosis of hypertension     . Dilated intrahepatic bile duct     Palliative Care Plan    Recommendations/Plan:  Home with Hospice.  Family requests [facility].   Patient does not need a Hospital bed initially.  Will write Rx for liquid pain medication (rather than pill form) as patient will not be able to swallow pills.  Agree with fentanyl patch.  Phenergan Rx written for nausea.  Will complete gold form for her to take with her.  Goals of Care and Additional Recommendations:  Limitations on Scope of Treatment: Full Comfort Care  Code Status:  DNR  Prognosis:   < 3 months   Discharge Planning:  Home with Hospice  Care plan was discussed with patient, family, TOC, attending MD.  Thank you for allowing the Palliative Medicine Team to assist in the care of this patient.  Total time spent:  35 min.     Greater than 50%  of this time was spent counseling and coordinating care related to the above assessment and plan.  Florentina Jenny, PA-C Palliative Medicine  Please contact Palliative MedicineTeam phone at [phone] for questions and concerns between 7 am - 7 pm.   Please see AMION for individual provider pager numbers.

## 2019-06-05 NOTE — Plan of Care (Signed)

## 2019-06-05 NOTE — TOC Transition Note (Signed)
Transition of Care [facility]) - CM/SW Discharge Note   Patient Details  Name: Tracy Odonnell MRN: [id number] Date of Birth: February 22, 1941  [facility] of [facility]) CM/SW Contact:  Claudie Leach, RN 06/05/2019, 2:18 PM   Clinical Narrative:    Patient and cousin elected to go home with hospice after speaking to Dr. Marin Olp.  Extensive conversation with both patient and her cousin, [name], regarding hospice vs HH and hospice is intermittant visits, not 24 hour care.  They seem sure that home hospice is the best plan.  Family is available to provide assistance as needed until patient reaches point of admission to hospice house.    They want to go home today and will decide on DME after a day or two.  Patient will go home in POV.  Referral called and accepted by [facility], with [facility].  They will likely arrange visit for tomorrow morning.   [facility] notified services can be canceled.    Final next level of care: Home w Hospice Care Barriers to Discharge: No Barriers Identified   Patient Goals and CMS Choice Patient states their goals for this hospitalization and ongoing recovery are:: to have good quality of life CMS Medicare.gov Compare Post Acute Care list provided to:: Patient Choice offered to / list presented to : Patient   Discharge Plan and Services   Discharge Planning Services: CM Consult            DME Arranged: N/A         HH Arranged: PT HH Agency: [place] Date [place]: 06/05/19 Time [place]: 1217 Representative spoke with at [place]: Bedelia Person

## 2019-06-07 ENCOUNTER — Encounter: Payer: Self-pay | Admitting: *Deleted

## 2019-06-07 NOTE — Progress Notes (Signed)
Received update that patient is enrolled in Hospice.  Called and spoke to [place] at [employer] and cancelled paradigm request.

## 2019-06-26 DEATH — deceased
